# Patient Record
Sex: Female | Born: 1995 | Race: Black or African American | Hispanic: No | Marital: Married | Smoking: Never smoker
Health system: Southern US, Community
[De-identification: ages and names within clinical notes are randomized; demographics above are authoritative.]

## PROBLEM LIST (undated history)

## (undated) DIAGNOSIS — F419 Anxiety disorder, unspecified: Secondary | ICD-10-CM

## (undated) DIAGNOSIS — Z98891 History of uterine scar from previous surgery: Secondary | ICD-10-CM

## (undated) DIAGNOSIS — R519 Headache, unspecified: Secondary | ICD-10-CM

## (undated) DIAGNOSIS — R51 Headache: Secondary | ICD-10-CM

## (undated) DIAGNOSIS — N390 Urinary tract infection, site not specified: Secondary | ICD-10-CM

## (undated) HISTORY — PX: TONSILLECTOMY: SUR1361

---
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
## 2013-03-07 ENCOUNTER — Encounter (HOSPITAL_COMMUNITY): Payer: Self-pay | Admitting: Emergency Medicine

## 2013-03-07 ENCOUNTER — Emergency Department (INDEPENDENT_AMBULATORY_CARE_PROVIDER_SITE_OTHER)
Admission: EM | Admit: 2013-03-07 | Discharge: 2013-03-07 | Disposition: A | Discharge status: Home / Self Care | Payer: No Typology Code available for payment source | Source: Home / Self Care | Attending: Family Medicine | Admitting: Family Medicine

## 2013-03-07 DIAGNOSIS — N390 Urinary tract infection, site not specified: Secondary | ICD-10-CM

## 2013-03-07 LAB — POCT URINALYSIS DIP (DEVICE)
Bilirubin Urine: NEGATIVE
Glucose, UA: NEGATIVE mg/dL
KETONES UR: NEGATIVE mg/dL
Nitrite: NEGATIVE
PH: 5.5 (ref 5.0–8.0)
PROTEIN: 100 mg/dL — AB
SPECIFIC GRAVITY, URINE: 1.025 (ref 1.005–1.030)
UROBILINOGEN UA: 0.2 mg/dL (ref 0.0–1.0)

## 2013-03-07 MED ORDER — PHENAZOPYRIDINE HCL 200 MG PO TABS
200.0000 mg | ORAL_TABLET | Freq: Three times a day (TID) | ORAL | Status: DC
Start: 2013-03-07 — End: 2014-09-29

## 2013-03-07 MED ORDER — CEFUROXIME AXETIL 250 MG PO TABS: 250.0000 mg | ORAL_TABLET | Freq: Two times a day (BID) | ORAL | Status: DC

## 2013-03-07 NOTE — Discharge Instructions (Signed)
Urinary Tract Infection  Urinary tract infections (UTIs) can develop anywhere along your urinary tract. Your urinary tract is your body's drainage system for removing wastes and extra water. Your urinary tract includes two kidneys, two ureters, a bladder, and a urethra. Your kidneys are a pair of bean-shaped organs. Each kidney is about the size of your fist. They are located below your ribs, one on each side of your spine.  CAUSES  Infections are caused by microbes, which are microscopic organisms, including fungi, viruses, and bacteria. These organisms are so small that they can only be seen through a microscope. Bacteria are the microbes that most commonly cause UTIs.  SYMPTOMS   Symptoms of UTIs may vary by age and gender of the patient and by the location of the infection. Symptoms in young women typically include a frequent and intense urge to urinate and a painful, burning feeling in the bladder or urethra during urination. Older women and men are more likely to be tired, shaky, and weak and have muscle aches and abdominal pain. A fever may mean the infection is in your kidneys. Other symptoms of a kidney infection include pain in your back or sides below the ribs, nausea, and vomiting.  DIAGNOSIS  To diagnose a UTI, your caregiver will ask you about your symptoms. Your caregiver also will ask to provide a urine sample. The urine sample will be tested for bacteria and white blood cells. White blood cells are made by your body to help fight infection.  TREATMENT   Typically, UTIs can be treated with medication. Because most UTIs are caused by a bacterial infection, they usually can be treated with the use of antibiotics. The choice of antibiotic and length of treatment depend on your symptoms and the type of bacteria causing your infection.  HOME CARE INSTRUCTIONS   If you were prescribed antibiotics, take them exactly as your caregiver instructs you. Finish the medication even if you feel better after you  have only taken some of the medication.   Drink enough water and fluids to keep your urine clear or pale yellow.   Avoid caffeine, tea, and carbonated beverages. They tend to irritate your bladder.   Empty your bladder often. Avoid holding urine for long periods of time.   Empty your bladder before and after sexual intercourse.   After a bowel movement, women should cleanse from front to back. Use each tissue only once.  SEEK MEDICAL CARE IF:    You have back pain.   You develop a fever.   Your symptoms do not begin to resolve within 3 days.  SEEK IMMEDIATE MEDICAL CARE IF:    You have severe back pain or lower abdominal pain.   You develop chills.   You have nausea or vomiting.   You have continued burning or discomfort with urination.  MAKE SURE YOU:    Understand these instructions.   Will watch your condition.   Will get help right away if you are not doing well or get worse.  Document Released: 10/12/2004 Document Revised: 07/04/2011 Document Reviewed: 02/10/2011  Exit[employer] Patient Information 2014 [employer].

## 2013-03-07 NOTE — ED Provider Notes (Signed)
CSN: [id number]     Arrival date & time 03/07/13  1111 History   First MD Initiated Contact with Patient 03/07/13 1137     Chief Complaint  Patient presents with  . Urinary Tract Infection     (Consider location/radiation/quality/duration/timing/severity/associated sxs/prior Treatment) HPI Comments: 18 year old female presents complaining of possible urinary tract infection. Starting yesterday, she has had hematuria, dysuria, and lower abdominal pressure. She's had multiple urinary tract infections in the past with very similar symptoms. She denies flank pain, fever, NVD. Denies risk factors for STDs.  Patient is a 18 y.o. female presenting with urinary tract infection.  Urinary Tract Infection Associated symptoms include abdominal pain. Pertinent negatives include no chest pain and no shortness of breath.    History reviewed. No pertinent past medical history. Past Surgical History  Procedure Laterality Date  . Tonsillectomy     History reviewed. No pertinent family history. History  Substance Use Topics  . Smoking status: Never Smoker   . Smokeless tobacco: Not on file  . Alcohol Use: No   OB History   Grav Para Term Preterm Abortions TAB SAB Ect Mult Living                 Review of Systems  Constitutional: Negative for fever and chills.  Eyes: Negative for visual disturbance.  Respiratory: Negative for cough and shortness of breath.   Cardiovascular: Negative for chest pain, palpitations and leg swelling.  Gastrointestinal: Positive for abdominal pain. Negative for nausea and vomiting.  Endocrine: Negative for polydipsia and polyuria.  Genitourinary: Positive for dysuria and hematuria. Negative for urgency, frequency, flank pain, decreased urine volume and vaginal discharge.  Musculoskeletal: Negative for arthralgias and myalgias.  Skin: Negative for rash.  Neurological: Negative for dizziness, weakness and light-headedness.      Allergies  Review of patient's  allergies indicates no known allergies.  Home Medications   Current Outpatient Rx  Name  Route  Sig  Dispense  Refill  . cefUROXime (CEFTIN) 250 MG tablet   Oral   Take 1 tablet (250 mg total) by mouth 2 (two) times daily with a meal.   10 tablet   0   . cephALEXin (KEFLEX) 500 MG capsule   Oral   Take 1 capsule (500 mg total) by mouth 4 (four) times daily.   28 capsule   0   . phenazopyridine (PYRIDIUM) 200 MG tablet   Oral   Take 1 tablet (200 mg total) by mouth 3 (three) times daily.   6 tablet   0    BP 93/79  Pulse 76  Temp(Src) 98 F (36.7 C) (Oral)  Resp 16  SpO2 100%  LMP 02/20/2013 Physical Exam  Nursing note and vitals reviewed. Constitutional: She is oriented to person, place, and time. Vital signs are normal. She appears well-developed and well-nourished. No distress.  HENT:  Head: Normocephalic and atraumatic.  Pulmonary/Chest: Effort normal. No respiratory distress.  Abdominal: Soft. She exhibits no mass. There is tenderness (minimal) in the suprapubic area. There is no rebound, no guarding and no CVA tenderness.  Neurological: She is alert and oriented to person, place, and time. She has normal strength. Coordination normal.  Skin: Skin is warm and dry. No rash noted. She is not diaphoretic.  Psychiatric: She has a normal mood and affect. Judgment normal.    ED Course  Procedures (including critical care time) Labs Review Labs Reviewed  POCT URINALYSIS DIP (DEVICE) - Abnormal; Notable for the following:    Hgb  urine dipstick LARGE (*)    Protein, ur 100 (*)    Leukocytes, UA MODERATE (*)    All other components within normal limits  URINE CULTURE   Imaging Review No results found.    MDM   Final diagnoses:  UTI (lower urinary tract infection)    Treated with Ceftin and Pyridium. Urine culture sent. Followup when necessary   Meds ordered this encounter  Medications  . cefUROXime (CEFTIN) 250 MG tablet    Sig: Take 1 tablet (250 mg  total) by mouth 2 (two) times daily with a meal.    Dispense:  10 tablet    Refill:  0    Order Specific Question:  Supervising Provider    Answer:  Linna HoffKINDL, JAMES D [phone][[id number]]  . phenazopyridine (PYRIDIUM) 200 MG tablet    Sig: Take 1 tablet (200 mg total) by mouth 3 (three) times daily.    Dispense:  6 tablet    Refill:  0    Order Specific Question:  Supervising Provider    Answer:  Bradd CanaryKINDL, JAMES D [[id number]]       Kristen GoodZachary H Kasim Mccorkle, PA-C 03/07/13 [id number]

## 2013-03-07 NOTE — ED Provider Notes (Signed)
Medical screening examination/treatment/procedure(s) were performed by resident physician or non-physician practitioner and as supervising physician I was immediately available for consultation/collaboration.   Jenavive Lamboy DOUGLAS MD.   Lewellyn Fultz D Kiran Lapine, MD 03/07/13 1432 

## 2013-03-07 NOTE — ED Notes (Signed)
C/o dysuria. Hematuria.  Since yesterday. Denies fever and any other symptoms.  No relief with AZO or cranberry.

## 2013-03-09 LAB — URINE CULTURE: Colony Count: 40000

## 2013-03-10 NOTE — ED Notes (Signed)
Urine culture: 40,000 colonies E. Coli.  Pt. adequately treated with Ceftin per Dr. Lorenz CoasterKeller.  Vassie MoselleYork, Jada Kuhnert M 03/10/2013

## 2013-11-17 ENCOUNTER — Emergency Department (HOSPITAL_COMMUNITY): Payer: No Typology Code available for payment source

## 2013-11-17 ENCOUNTER — Encounter (HOSPITAL_COMMUNITY): Payer: Self-pay | Admitting: Emergency Medicine

## 2013-11-17 ENCOUNTER — Emergency Department (HOSPITAL_COMMUNITY): Payer: Self-pay

## 2013-11-17 ENCOUNTER — Emergency Department (HOSPITAL_COMMUNITY)
Admission: EM | Admit: 2013-11-17 | Discharge: 2013-11-17 | Disposition: A | Discharge status: Home / Self Care | Payer: Self-pay | Attending: Emergency Medicine | Admitting: Emergency Medicine

## 2013-11-17 DIAGNOSIS — Z792 Long term (current) use of antibiotics: Secondary | ICD-10-CM | POA: Insufficient documentation

## 2013-11-17 DIAGNOSIS — Z3202 Encounter for pregnancy test, result negative: Secondary | ICD-10-CM | POA: Insufficient documentation

## 2013-11-17 DIAGNOSIS — R05 Cough: Secondary | ICD-10-CM | POA: Insufficient documentation

## 2013-11-17 DIAGNOSIS — R079 Chest pain, unspecified: Secondary | ICD-10-CM | POA: Insufficient documentation

## 2013-11-17 DIAGNOSIS — Z79899 Other long term (current) drug therapy: Secondary | ICD-10-CM | POA: Insufficient documentation

## 2013-11-17 LAB — POC URINE PREG, ED: Preg Test, Ur: NEGATIVE

## 2013-11-17 MED ORDER — KETOROLAC TROMETHAMINE 60 MG/2ML IM SOLN
30.0000 mg | Freq: Once | INTRAMUSCULAR | Status: AC
  Administered 2013-11-17: 30 mg via INTRAMUSCULAR
  Filled 2013-11-17: qty 2

## 2013-11-17 NOTE — ED Notes (Signed)
Sudden onset on chest pain started approx 1am last night, while watching tv. Has had a  Cold for 2 days, with coughing.

## 2013-11-17 NOTE — ED Notes (Signed)
Pt c/o mid sternal CP starting last night; pt sts URI sx recently

## 2013-11-17 NOTE — ED Notes (Signed)
Returned from xray

## 2013-11-17 NOTE — ED Provider Notes (Signed)
Patient seen/examined in the Emergency Department in conjunction with [facility] Patient reports recent cough and now has right sided CP Exam : awake/alert, watching TV, point tenderness to right chest Plan: imaging pending.  If negative will d/c home    Joya Gaskinsonald W Darly Massi, MD 11/17/13 1147

## 2013-11-17 NOTE — Discharge Instructions (Signed)
Chest Pain (Nonspecific) °It is often hard to give a diagnosis for the cause of chest pain. There is always a chance that your pain could be related to something serious, such as a heart attack or a blood clot in the lungs. You need to follow up with your doctor. °HOME CARE °· If antibiotic medicine was given, take it as directed by your doctor. Finish the medicine even if you start to feel better. °· For the next few days, avoid activities that bring on chest pain. Continue physical activities as told by your doctor. °· Do not use any tobacco products. This includes cigarettes, chewing tobacco, and e-cigarettes. °· Avoid drinking alcohol. °· Only take medicine as told by your doctor. °· Follow your doctor's suggestions for more testing if your chest pain does not go away. °· Keep all doctor visits you made. °GET HELP IF: °· Your chest pain does not go away, even after treatment. °· You have a rash with blisters on your chest. °· You have a fever. °GET HELP RIGHT AWAY IF:  °· You have more pain or pain that spreads to your arm, neck, jaw, back, or belly (abdomen). °· You have shortness of breath. °· You cough more than usual or cough up blood. °· You have very bad back or belly pain. °· You feel sick to your stomach (nauseous) or throw up (vomit). °· You have very bad weakness. °· You pass out (faint). °· You have chills. °This is an emergency. Do not wait to see if the problems will go away. Call your local emergency services (911 in [place] Do not drive yourself to the hospital. °MAKE SURE YOU:  °· Understand these instructions. °· Will watch your condition. °· Will get help right away if you are not doing well or get worse. °Document Released: 06/21/2007 Document Revised: 01/07/2013 Document Reviewed: 06/21/2007 °ExitCare® Patient Information ©2015 [employer]. This information is not intended to replace advice given to you by your health care provider. Make sure you discuss any questions you have with your  health care provider. ° ° °Emergency Department Resource Guide °1) Find a Doctor and Pay Out of Pocket °Although you won't have to find out who is covered by your insurance plan, it is a good idea to ask around and get recommendations. You will then need to call the office and see if the doctor you have chosen will accept you as a new patient and what types of options they offer for patients who are self-pay. Some doctors offer discounts or will set up payment plans for their patients who do not have insurance, but you will need to ask so you aren't surprised when you get to your appointment. ° °2) Contact Your Local Health Department °Not all health departments have doctors that can see patients for sick visits, but many do, so it is worth a call to see if yours does. If you don't know where your local health department is, you can check in your phone book. The CDC also has a tool to help you locate your state's health department, and many state websites also have listings of all of their local health departments. ° °3) Find a Walk-in Clinic °If your illness is not likely to be very severe or complicated, you may want to try a walk in clinic. These are popping up all over the country in pharmacies, drugstores, and shopping centers. They're usually staffed by nurse practitioners or physician assistants that have been trained to treat common   illnesses and complaints. They're usually fairly quick and inexpensive. However, if you have serious medical issues or chronic medical problems, these are probably not your best option. ° °No Primary Care Doctor: °- Call [facility] at  832-8000 - they can help you locate a primary care doctor that  accepts your insurance, provides certain services, etc. °- Physician Referral Service- 1-800-533-3463 ° °Chronic Pain Problems: °Organization         Address  Phone   Notes  ° [facility]  (336) 297-2271 Patients need to be referred by their primary care doctor.   ° °Medication Assistance: °Organization         Address  Phone   Notes  °[place] [facility] [address] °[place], [place] [address] (336) 641-8030 --Must be a resident of [place] °-- Must have NO insurance coverage whatsoever (no Medicaid/ Medicare, etc.) °-- The pt. MUST have a primary care doctor that directs their care regularly and follows them in the community °  °[facility]  (866) 331-1348   °[employer]  (888) 892-1162   ° °Agencies that provide inexpensive medical care: °Organization         Address  Phone   Notes  °[facility]  (336) 832-8035   °[facility]    (336) 832-7272   °[facility] [address] °[place], [place] [address] (336) 832-4777   °[facility] [address], °[place] (336) 271-4999   °[facility]    (336) 373-0678   °[facility]    (336) 272-1050   °[facility] ° [address], [place] Phone:  (336) 832-4444, Fax:  (336) 832-4440 Hours of Operation:  9 am - 6 pm, M-F.  Also accepts Medicaid/Medicare and self-pay.  °[facility] ° [address], [place] Phone: (336) 832-3150, Fax: (336) 832-3151. Hours of Operation:  8:30 am - 5:30 pm, M-F.  Also accepts Medicaid and self-pay.  °[facility] [place] [address], [place] Phone: (336) 878-6027   °[facility] [address], [place], [place] (336)723-1848, Ext. 123 Mondays & Thursdays: 7-9 AM.  First 15 patients are seen on a first come, first serve basis. °  ° °Medicaid-accepting [place] Providers: ° °Organization         Address  Phone   Notes  °[facility] [address], [place] (336) 641-2100 Also accepts self-pay patients.  °[facility] [address], [place] ° (336) 856-9996   °[facility] [address], [place] (336) 288-8857   °[facility] [address] [place] [address], [place] (336) 299-7000   °Veita Bland [address], [place]  ° (336) 373-1557 Only accepts [place] Access Medicaid patients after they have their name applied to their card.  ° °Self-Pay (no insurance) in [place]: ° °Organization         Address  Phone   Notes  °Sickle Cell Patients, [facility] [address], [place] (336) 832-1970   °[facility] [address], [place] (336) 832-4400   °[facility] ° [address], [place] (336) 992-4800   °[facility]/Dr. Osei-Bonsu ° [address] [place] [address],  or [address],   [place] (336) 841-8500 Phone number for both [place] and [place] locations is the same.  °[facility] [address], [place] (336) 299-0000   °[facility] [address] [place] [address], [place] or [address] (336) 852-7530 °(336) 878-2260   °[facility] [address], [place] (336) 350-1642, phone; (336) 294-5005, fax Sees patients 1st and 3rd Saturday of every month.  Must not qualify for public or private insurance (i.e. Medicaid, Medicare, [place] Health Choice, Veterans' Benefits) • Household income should be no more than 200% of the poverty level •The clinic cannot treat you if you are pregnant or think you are pregnant • Sexually transmitted diseases are not treated at the clinic.  ° ° °Dental Care: °Organization         Address  Phone  Notes  °[facility] [address], [place] (336) 641-6152 Accepts children up to age 21 who are enrolled in Medicaid or [place] Health Choice; pregnant women with a Medicaid card; and children who have applied for Medicaid or [place] Health Choice, but were declined, whose parents can pay a reduced fee at time of service.    °[facility] [place]  [address], [place] (336) 641-7733 Accepts children up to age 21 who are enrolled in Medicaid or [place] Health Choice; pregnant women with a Medicaid card; and children who have applied for Medicaid or [place] Health Choice, but were declined, whose parents can pay a reduced fee at time of service.  °[facility] ° [address], [place] (336) 641-4533 Patients are seen by appointment only. Walk-ins are not accepted. [facility] will see patients 18 years of age and older. °Monday - Tuesday (8am-5pm) °Most Wednesdays (8:30-5pm) °$30 per visit, cash only  °[facility] ° [address], [place] (336) 641-4533 Patients are seen by appointment only. Walk-ins are not accepted. [facility] will see patients 18 years of age and older. °One Wednesday Evening (Monthly: Volunteer Based).  $30 per visit, cash only  °[facility]  (919) 537-3737 for adults; Children under age 4, call Graduate Pediatric Dentistry at (919) [phone]. Children aged 4-14, please call (919) [phone] to request a pediatric application. ° Dental services are provided in all areas of dental care including fillings, crowns and bridges, complete and partial dentures, implants, gum treatment, root canals, and extractions. Preventive care is also provided. Treatment is provided to both adults and children. °Patients are selected via a lottery and there is often a waiting list. °  °[facility] [address], °[place] ° (336) 763-8833 [email or web address] °  °[facility] [address], [place], [place] (336)723-1848, Ext. 123 Second and Fourth Thursday of each month, opens at 6:30 AM; Clinic ends at 9 AM.  Patients are seen on a first-come first-served basis, and a limited number are seen during each clinic.  ° °[facility] ° [address], [place], [place] (336)  723-7904   Eligibility Requirements °You must have lived in [place], [place], or [place] counties for at least the last three months. °  You cannot be eligible for state or federal sponsored healthcare insurance, including Veterans Administration, Medicaid, or Medicare. °  You generally cannot be eligible for healthcare insurance through your employer.  °  How to apply: °Eligibility screenings are held every Tuesday   and Wednesday afternoon from 1:00 pm until 4:00 pm. You do not need an appointment for the interview!  °[facility] [address], [place], [place] 336-631-2330   °[facility]  336-342-8273   °[facility]  336-703-3100   °[facility]  336-570-6415   ° °Behavioral Health Resources in the Community: °Intensive Outpatient Programs °Organization         Address  Phone  Notes  °[facility] [address], [place], [place] 336-878-6098   °[facility] [address], [place], [place] 336-832-9800   °[facility] [address], [place], [place] ° 336-882-2125   °[facility] [address],  °[place], [place] 1-800-853-5163 or 336-641-4981   °Substance Abuse Resources °Organization         Address  Phone  Notes  °[facility]  336-882-2125   °[facility]  336-784-9470   °[facility]  336-285-9073   °[facility]  336-845-3988   °Residential & Outpatient Substance Abuse Program  1-800-659-3381   °Psychological Services °Organization         Address  Phone  Notes  °[facility]  336- 832-9600   °[facility]  336- 378-7881   °[facility] [address], [place] 1-800-853-5163 or 336-641-4981   ° °Mobile Crisis Teams °Organization         Address  Phone  Notes  °[facility], Mobile Crisis Care Unit  1-877-626-1772   °Assertive °[facility] ° [address] [place], [place] 336-834-9664   °Sharon DeEsch [address] °[place] 336-554-5454   ° °Self-Help/Support Groups °Organization         Address  Phone             Notes  °[facility] - variety of support groups  336- 373-1402 Call for more information  °Narcotics Anonymous (NA), [facility] [address], °[place]  2 meetings at this location  ° °Residential Treatment Programs °Organization         Address  Phone  Notes  °[facility] [address],    °[place]  1-866-801-8205   °[facility] ° [address], [place], [place] 704-293-8524   °[facility] [address], [place] 336-845-3988 Admissions: 8am-3pm M-F  °[facility] [address]    °[place], [place] 336-841-1104   °[facility] [address], [place], [place] 336-379-7146   °[facility] [address]  °[place], [place] 336-285-9073   °[facility] - Intensive Outpatient [address], [place], [place] 336-852-3033   °[facility] [address]  °[place], [place] 1-877-615-2722 or 336-784-9470   °[facility] [address] [place], [place] 336-227-7417 Accepts Medicaid  °[facility] [address]  °[place] 1-800-659-3381 Substance Abuse/Addiction Treatment  ° °[place] Behavioral Health Resources °Organization         Address  Phone  Notes  °[facility]  (888) 581-9988   °Julie Brannon, PhD [address] [place], [place]   (336) 349-5553 or (336) 951-0000   °[facility]   [address] °[place], [place] (336) 349-4454   °[facility] [address], [place],  (336) 342-8316 Insurance/Medicaid/sponsorship through [facility]  °[facility] [address]                                    [place], [place] (336) 342-8316  Therapy/tele-psych/case  °[facility] [address]  ° [place], [place] (336) 349-2233    °Dr. Arfeen  (336) 349-4544   °[facility] [place]  [employer] [place] [facility] 1) [address], [place] °2) [address], [place] °3)  [address], [place] (336) 349-3220 °(336) 342-7768 ° °(336) 342-8140   °[place] [facility] (336) 342-1394 or (336) 342-3537 (After Hours)    ° ° ° °

## 2013-11-17 NOTE — ED Provider Notes (Signed)
CSN: [id number]     Arrival date & time 11/17/13  1004 History   First MD Initiated Contact with Patient 11/17/13 1015     Chief Complaint  Patient presents with  . Chest Pain  . URI    Patient is a 18 y.o. female presenting with chest pain. The history is provided by the patient.  Chest Pain Chest pain location: right costosternal margin. Pain quality: aching   Pain radiates to:  Does not radiate Pain radiates to the back: no   Pain severity:  Moderate Onset quality:  Sudden Duration:  10 hours Timing:  Constant Progression:  Unchanged Chronicity:  New Context: at rest   Context: not breathing   Relieved by:  None tried Worsened by:  Nothing tried Ineffective treatments:  None tried Associated symptoms: cough   Associated symptoms: no abdominal pain, no altered mental status, no back pain, no diaphoresis, no fever, no headache, no lower extremity edema, no nausea, no shortness of breath and not vomiting   Cough:    Cough characteristics:  Non-productive   Severity:  Moderate   Onset quality:  Gradual   Duration:  2 days   Timing:  Intermittent   Progression:  Improving   Chronicity:  New Risk factors: no birth control, no immobilization, not obese, no prior DVT/PE and no smoking     History reviewed. No pertinent past medical history. Past Surgical History  Procedure Laterality Date  . Tonsillectomy     History reviewed. No pertinent family history. History  Substance Use Topics  . Smoking status: Never Smoker   . Smokeless tobacco: Not on file  . Alcohol Use: No   OB History    No data available     Review of Systems  Constitutional: Negative for fever and diaphoresis.  Respiratory: Positive for cough. Negative for shortness of breath.   Cardiovascular: Positive for chest pain.  Gastrointestinal: Negative for nausea, vomiting and abdominal pain.  Musculoskeletal: Negative for back pain.  Skin: Negative for rash.  Neurological: Negative for headaches.   All other systems reviewed and are negative.   Allergies  Review of patient's allergies indicates no known allergies.  Home Medications   Prior to Admission medications   Medication Sig Start Date End Date Taking? Authorizing Provider  cefUROXime (CEFTIN) 250 MG tablet Take 1 tablet (250 mg total) by mouth 2 (two) times daily with a meal. 03/07/13   Graylon GoodZachary H Baker, PA-C  cephALEXin (KEFLEX) 500 MG capsule Take 1 capsule (500 mg total) by mouth 4 (four) times daily. 03/14/12   Hayden Rasmussenavid Mabe, NP  phenazopyridine (PYRIDIUM) 200 MG tablet Take 1 tablet (200 mg total) by mouth 3 (three) times daily. 03/07/13   Adrian BlackwaterZachary H Baker, PA-C   BP 127/81 mmHg  Pulse 88  Temp(Src) 97.6 F (36.4 C) (Oral)  Resp 14  Ht 4\' 11"  (1.499 m)  Wt 110 lb (49.896 kg)  BMI 22.21 kg/m2  SpO2 99%   Physical Exam  Constitutional: She is oriented to person, place, and time. She appears well-developed and well-nourished. She is cooperative. No distress.  HENT:  Head: Normocephalic and atraumatic.  Right Ear: External ear normal.  Left Ear: External ear normal.  Neck: Normal range of motion and phonation normal.  Cardiovascular: Normal rate and regular rhythm.   Pulses:      Radial pulses are 2+ on the right side, and 2+ on the left side.       Dorsalis pedis pulses are 2+ on the right  side, and 2+ on the left side.  No peripheral edema  Pulmonary/Chest: Effort normal and breath sounds normal. No respiratory distress. She has no wheezes. She has no rales. She exhibits tenderness.  Point tender at the right costosternal margin  Abdominal: Soft. She exhibits no distension. There is no tenderness. There is no rebound and no guarding.  Neurological: She is alert and oriented to person, place, and time. GCS eye subscore is 4. GCS verbal subscore is 5. GCS motor subscore is 6.  Skin: Skin is warm and dry. No rash noted. She is not diaphoretic.    ED Course  Procedures  Labs Review Results for orders placed or  performed during the hospital encounter of 11/17/13  POC urine preg, ED ([facility])  Result Value Ref Range   Preg Test, Ur NEGATIVE NEGATIVE   Imaging Review Dg Chest 2 View  11/17/2013   CLINICAL DATA:  Sudden onset chest pain.  Cough  EXAM: CHEST  2 VIEW  COMPARISON:  None.  FINDINGS: Lungs are clear. Heart size and pulmonary vascularity are normal. No adenopathy. No pneumothorax. No bone lesions.  IMPRESSION: No abnormality noted.   Electronically Signed   By: Bretta BangWilliam  Woodruff M.D.   On: 11/17/2013 11:52     EKG Interpretation   Date/Time:  Monday November 17 2013 10:10:17 EST Ventricular Rate:  112 PR Interval:  136 QRS Duration: 84 QT Interval:  350 QTC Calculation: 477 R Axis:   66 Text Interpretation:  Sinus tachycardia Nonspecific T wave abnormality  Abnormal ECG No previous ECGs available Confirmed by Bebe ShaggyWICKLINE  MD, DONALD  [phone]([id number]) on 11/17/2013 10:18:28 AM      MDM   Final diagnoses:  Chest pain   18 y.o. otherwise healthy female presenting with aching right sided chest pain. Worse with palpation. Very atypical. Not on any birth control, no unilateral leg swelling, no hx of DVT/PE, no recent surgeries. Her pain is reproducible, point tender over her right costosternal margin - I have no concern for PE, ACS. CXR with no pneumothorax, pneumonia, no wide mediastinum.   Will give her a dose of IM toradol and reassess.   On re-evaluation the patient is pain free after a dose of IM toradol. I discussed very strict return precautions with the patient and her mother -- these were given in writing as well. She is to follow up with a PCP (resource guide given).   This case managed in conjunction with my attending, Dr. Bebe ShaggyWickline.    Maxine GlennAnn Sherrill Buikema, MD 11/17/13 [phone]

## 2013-12-23 ENCOUNTER — Encounter (HOSPITAL_COMMUNITY): Payer: Self-pay | Admitting: Emergency Medicine

## 2013-12-23 ENCOUNTER — Emergency Department (INDEPENDENT_AMBULATORY_CARE_PROVIDER_SITE_OTHER)
Admission: EM | Admit: 2013-12-23 | Discharge: 2013-12-23 | Disposition: A | Discharge status: Home / Self Care | Payer: Self-pay | Source: Home / Self Care | Attending: Family Medicine | Admitting: Family Medicine

## 2013-12-23 DIAGNOSIS — B349 Viral infection, unspecified: Secondary | ICD-10-CM

## 2013-12-23 DIAGNOSIS — G44209 Tension-type headache, unspecified, not intractable: Secondary | ICD-10-CM

## 2013-12-23 DIAGNOSIS — R1084 Generalized abdominal pain: Secondary | ICD-10-CM

## 2013-12-23 LAB — POCT URINALYSIS DIP (DEVICE)
GLUCOSE, UA: NEGATIVE mg/dL
Ketones, ur: 80 mg/dL — AB
NITRITE: NEGATIVE
PROTEIN: 100 mg/dL — AB
SPECIFIC GRAVITY, URINE: 1.015 (ref 1.005–1.030)
UROBILINOGEN UA: 2 mg/dL — AB (ref 0.0–1.0)
pH: 6 (ref 5.0–8.0)

## 2013-12-23 LAB — POCT PREGNANCY, URINE: PREG TEST UR: NEGATIVE

## 2013-12-23 NOTE — ED Provider Notes (Signed)
CSN: [id number]     Arrival date & time 12/23/13  1015 History   First MD Initiated Contact with Patient 12/23/13 1041     Chief Complaint  Patient presents with  . Abdominal Pain  . Headache   (Consider location/radiation/quality/duration/timing/severity/associated sxs/prior Treatment)  HPI            18 year old female presents complaining of headache and abdominal pain. This initially started on Friday, 3 days ago. She had onset of fever to 102F, headache across her forehead, mild central abdominal pain. Since then her symptoms have gotten better. She has had no more fever any abdominal pain slightly better. She still getting the headache which is consistently responsive to ibuprofen. She denies any nausea, vomiting, diarrhea. Abdominal pain is mild, 3-4/10. No chest pain or shortness of breath. No cough. No recent travel or sick contacts   History reviewed. No pertinent past medical history. Past Surgical History  Procedure Laterality Date  . Tonsillectomy     No family history on file. History  Substance Use Topics  . Smoking status: Never Smoker   . Smokeless tobacco: Not on file  . Alcohol Use: No   OB History    No data available     Review of Systems  Constitutional: Positive for fever, chills and fatigue.  HENT: Positive for congestion and sore throat (mild, mostly resolved).   Respiratory: Negative for cough.   Gastrointestinal: Positive for abdominal pain. Negative for nausea, vomiting and diarrhea.  Neurological: Positive for headaches. Negative for speech difficulty.  All other systems reviewed and are negative.   Allergies  Review of patient's allergies indicates no known allergies.  Home Medications   Prior to Admission medications   Medication Sig Start Date End Date Taking? Authorizing Provider  cefUROXime (CEFTIN) 250 MG tablet Take 1 tablet (250 mg total) by mouth 2 (two) times daily with a meal. 03/07/13   Graylon GoodZachary H Kylie Simmonds, PA-C  cephALEXin (KEFLEX) 500  MG capsule Take 1 capsule (500 mg total) by mouth 4 (four) times daily. 03/14/12   Hayden Rasmussenavid Mabe, NP  phenazopyridine (PYRIDIUM) 200 MG tablet Take 1 tablet (200 mg total) by mouth 3 (three) times daily. 03/07/13   Adrian BlackwaterZachary H Keviana Guida, PA-C   BP 100/62 mmHg  Pulse 106  Temp(Src) 97.8 F (36.6 C) (Oral)  Resp 16  SpO2 100% Physical Exam  Constitutional: She is oriented to person, place, and time. Vital signs are normal. She appears well-developed and well-nourished. No distress.  HENT:  Head: Normocephalic and atraumatic.  Right Ear: External ear normal.  Left Ear: External ear normal.  Nose: Nose normal.  Mouth/Throat: Oropharynx is clear and moist. No oropharyngeal exudate.  Eyes: Conjunctivae are normal. Right eye exhibits no discharge. Left eye exhibits no discharge.  Neck: Normal range of motion. Neck supple.  Cardiovascular: Normal rate, regular rhythm and normal heart sounds.   Heart rate 88 manually  Pulmonary/Chest: Effort normal and breath sounds normal. No respiratory distress.  Abdominal: Soft. Bowel sounds are normal. She exhibits no distension and no mass. There is tenderness (mild tenderness in the epigastrium and periumbilical). There is no rebound, no guarding and no CVA tenderness.  Lymphadenopathy:    She has no cervical adenopathy.  Neurological: She is alert and oriented to person, place, and time. She has normal strength. Coordination normal.  Skin: Skin is warm and dry. No rash noted. She is not diaphoretic.  Psychiatric: She has a normal mood and affect. Judgment normal.  Nursing note and vitals  reviewed.   ED Course  Procedures (including critical care time) Labs Review Labs Reviewed  POCT URINALYSIS DIP (DEVICE) - Abnormal; Notable for the following:    Bilirubin Urine SMALL (*)    Ketones, ur 80 (*)    Hgb urine dipstick TRACE (*)    Protein, ur 100 (*)    Urobilinogen, UA 2.0 (*)    Leukocytes, UA SMALL (*)    All other components within normal limits   URINE CULTURE  POCT PREGNANCY, URINE    Imaging Review No results found.   MDM   1. Tension headache   2. Abdominal pain, generalized   3. Viral illness    Urine culture sent.  Most likely viral illness. Continue to treat symptomatically. Follow-up if worsening.    Graylon GoodZachary H Lamarion Mcevers, PA-C 12/23/13 [phone]

## 2013-12-23 NOTE — Discharge Instructions (Signed)
Abdominal Pain, Women °Abdominal (stomach, pelvic, or belly) pain can be caused by many things. It is important to tell your doctor: °· The location of the pain. °· Does it come and go or is it present all the time? °· Are there things that start the pain (eating certain foods, exercise)? °· Are there other symptoms associated with the pain (fever, nausea, vomiting, diarrhea)? °All of this is helpful to know when trying to find the cause of the pain. °CAUSES  °· Stomach: virus or bacteria infection, or ulcer. °· Intestine: appendicitis (inflamed appendix), regional ileitis (Crohn's disease), ulcerative colitis (inflamed colon), irritable bowel syndrome, diverticulitis (inflamed diverticulum of the colon), or cancer of the stomach or intestine. °· Gallbladder disease or stones in the gallbladder. °· Kidney disease, kidney stones, or infection. °· Pancreas infection or cancer. °· Fibromyalgia (pain disorder). °· Diseases of the female organs: °· Uterus: fibroid (non-cancerous) tumors or infection. °· Fallopian tubes: infection or tubal pregnancy. °· Ovary: cysts or tumors. °· Pelvic adhesions (scar tissue). °· Endometriosis (uterus lining tissue growing in the pelvis and on the pelvic organs). °· Pelvic congestion syndrome (female organs filling up with blood just before the menstrual period). °· Pain with the menstrual period. °· Pain with ovulation (producing an egg). °· Pain with an IUD (intrauterine device, birth control) in the uterus. °· Cancer of the female organs. °· Functional pain (pain not caused by a disease, may improve without treatment). °· Psychological pain. °· Depression. °DIAGNOSIS  °Your doctor will decide the seriousness of your pain by doing an examination. °· Blood tests. °· X-rays. °· Ultrasound. °· CT scan (computed tomography, special type of X-ray). °· MRI (magnetic resonance imaging). °· Cultures, for infection. °· Barium enema (dye inserted in the large intestine, to better view it with  X-rays). °· Colonoscopy (looking in intestine with a lighted tube). °· Laparoscopy (minor surgery, looking in abdomen with a lighted tube). °· Major abdominal exploratory surgery (looking in abdomen with a large incision). °TREATMENT  °The treatment will depend on the cause of the pain.  °· Many cases can be observed and treated at home. °· Over-the-counter medicines recommended by your caregiver. °· Prescription medicine. °· Antibiotics, for infection. °· Birth control pills, for painful periods or for ovulation pain. °· Hormone treatment, for endometriosis. °· Nerve blocking injections. °· Physical therapy. °· Antidepressants. °· Counseling with a psychologist or psychiatrist. °· Minor or major surgery. °HOME CARE INSTRUCTIONS  °· Do not take laxatives, unless directed by your caregiver. °· Take over-the-counter pain medicine only if ordered by your caregiver. Do not take aspirin because it can cause an upset stomach or bleeding. °· Try a clear liquid diet (broth or water) as ordered by your caregiver. Slowly move to a bland diet, as tolerated, if the pain is related to the stomach or intestine. °· Have a thermometer and take your temperature several times a day, and record it. °· Bed rest and sleep, if it helps the pain. °· Avoid sexual intercourse, if it causes pain. °· Avoid stressful situations. °· Keep your follow-up appointments and tests, as your caregiver orders. °· If the pain does not go away with medicine or surgery, you may try: °· Acupuncture. °· Relaxation exercises (yoga, meditation). °· Group therapy. °· Counseling. °SEEK MEDICAL CARE IF:  °· You notice certain foods cause stomach pain. °· Your home care treatment is not helping your pain. °· You need stronger pain medicine. °· You want your IUD removed. °· You feel faint or   lightheaded.  You develop nausea and vomiting.  You develop a rash.  You are having side effects or an allergy to your medicine. SEEK IMMEDIATE MEDICAL CARE IF:   Your  pain does not go away or gets worse.  You have a fever.  Your pain is felt only in portions of the abdomen. The right side could possibly be appendicitis. The left lower portion of the abdomen could be colitis or diverticulitis.  You are passing blood in your stools (bright red or black tarry stools, with or without vomiting).  You have blood in your urine.  You develop chills, with or without a fever.  You pass out. MAKE SURE YOU:   Understand these instructions.  Will watch your condition.  Will get help right away if you are not doing well or get worse. Document Released: 10/30/2006 Document Revised: 05/19/2013 Document Reviewed: 11/19/2008 [facility] Patient Information 2015 [place], [place]. This information is not intended to replace advice given to you by your health care provider. Make sure you discuss any questions you have with your health care provider.  Tension Headache A tension headache is a feeling of pain, pressure, or aching often felt over the front and sides of the head. The pain can be dull or can feel tight (constricting). It is the most common type of headache. Tension headaches are not normally associated with nausea or vomiting and do not get worse with physical activity. Tension headaches can last 30 minutes to several days.  CAUSES  The exact cause is not known, but it may be caused by chemicals and hormones in the brain that lead to pain. Tension headaches often begin after stress, anxiety, or depression. Other triggers may include:  Alcohol.  Caffeine (too much or withdrawal).  Respiratory infections (colds, flu, sinus infections).  Dental problems or teeth clenching.  Fatigue.  Holding your head and neck in one position too long while using a computer. SYMPTOMS   Pressure around the head.   Dull, aching head pain.   Pain felt over the front and sides of the head.   Tenderness in the muscles of the head, neck, and shoulders. DIAGNOSIS  A  tension headache is often diagnosed based on:   Symptoms.   Physical examination.   A CT scan or MRI of your head. These tests may be ordered if symptoms are severe or unusual. TREATMENT  Medicines may be given to help relieve symptoms.  HOME CARE INSTRUCTIONS   Only take over-the-counter or prescription medicines for pain or discomfort as directed by your caregiver.   Lie down in a dark, quiet room when you have a headache.   Keep a journal to find out what may be triggering your headaches. For example, write down:  What you eat and drink.  How much sleep you get.  Any change to your diet or medicines.  Try massage or other relaxation techniques.   Ice packs or heat applied to the head and neck can be used. Use these 3 to 4 times per day for 15 to 20 minutes each time, or as needed.   Limit stress.   Sit up straight, and do not tense your muscles.   Quit smoking if you smoke.  Limit alcohol use.  Decrease the amount of caffeine you drink, or stop drinking caffeine.  Eat and exercise regularly.  Get 7 to 9 hours of sleep, or as recommended by your caregiver.  Avoid excessive use of pain medicine as recurrent headaches can occur.  SEEK MEDICAL CARE IF:   You have problems with the medicines you were prescribed.  Your medicines do not work.  You have a change from the usual headache.  You have nausea or vomiting. SEEK IMMEDIATE MEDICAL CARE IF:   Your headache becomes severe.  You have a fever.  You have a stiff neck.  You have loss of vision.  You have muscular weakness or loss of muscle control.  You lose your balance or have trouble walking.  You feel faint or pass out.  You have severe symptoms that are different from your first symptoms. MAKE SURE YOU:   Understand these instructions.  Will watch your condition.  Will get help right away if you are not doing well or get worse. Document Released: 01/02/2005 Document Revised:  03/27/2011 Document Reviewed: 12/23/2010 [facility]ExitCare Patient Information 2015 [place], [place]LL[place]. This information is not intended to replace advice given to you by your health care provider. Make sure you discuss any questions you have with your health care provider.  Viral Infections A viral infection can be caused by different types of viruses.Most viral infections are not serious and resolve on their own. However, some infections may cause severe symptoms and may lead to further complications. SYMPTOMS Viruses can frequently cause:  Minor sore throat.  Aches and pains.  Headaches.  Runny nose.  Different types of rashes.  Watery eyes.  Tiredness.  Cough.  Loss of appetite.  Gastrointestinal infections, resulting in nausea, vomiting, and diarrhea. These symptoms do not respond to antibiotics because the infection is not caused by bacteria. However, you might catch a bacterial infection following the viral infection. This is sometimes called a "superinfection." Symptoms of such a bacterial infection may include:  Worsening sore throat with pus and difficulty swallowing.  Swollen neck glands.  Chills and a high or persistent fever.  Severe headache.  Tenderness over the sinuses.  Persistent overall ill feeling (malaise), muscle aches, and tiredness (fatigue).  Persistent cough.  Yellow, green, or brown mucus production with coughing. HOME CARE INSTRUCTIONS   Only take over-the-counter or prescription medicines for pain, discomfort, diarrhea, or fever as directed by your caregiver.  Drink enough water and fluids to keep your urine clear or pale yellow. Sports drinks can provide valuable electrolytes, sugars, and hydration.  Get plenty of rest and maintain proper nutrition. Soups and broths with crackers or rice are fine. SEEK IMMEDIATE MEDICAL CARE IF:   You have severe headaches, shortness of breath, chest pain, neck pain, or an unusual rash.  You have uncontrolled  vomiting, diarrhea, or you are unable to keep down fluids.  You or your child has an oral temperature above 102 F (38.9 C), not controlled by medicine.  Your baby is older than 3 months with a rectal temperature of 102 F (38.9 C) or higher.  Your baby is 253 months old or younger with a rectal temperature of 100.4 F (38 C) or higher. MAKE SURE YOU:   Understand these instructions.  Will watch your condition.  Will get help right away if you are not doing well or get worse. Document Released: 10/12/2004 Document Revised: 03/27/2011 Document Reviewed: 05/09/2010 [facility]ExitCare Patient Information 2015 [place] GroveExitCare, [place]LLC. This information is not intended to replace advice given to you by your health care provider. Make sure you discuss any questions you have with your health care provider.

## 2013-12-23 NOTE — ED Notes (Signed)
C/o head pain, stomach soreness, pointing to center of abdomen.  No vomiting, no diarrhea, no nausea.  Denies urinary symptoms.  Denies vaginal discharge, last bm was this am and normal

## 2013-12-24 LAB — URINE CULTURE
Colony Count: NO GROWTH
Culture: NO GROWTH

## 2014-09-29 ENCOUNTER — Inpatient Hospital Stay (HOSPITAL_COMMUNITY)
Admission: AD | Admit: 2014-09-29 | Discharge: 2014-10-03 | DRG: 765 | Disposition: A | Discharge status: Home / Self Care | Payer: Medicaid Other | Source: Ambulatory Visit | Attending: Obstetrics and Gynecology | Admitting: Obstetrics and Gynecology

## 2014-09-29 ENCOUNTER — Encounter (HOSPITAL_COMMUNITY): Payer: Self-pay | Admitting: *Deleted

## 2014-09-29 DIAGNOSIS — Z713 Dietary counseling and surveillance: Secondary | ICD-10-CM | POA: Diagnosis present

## 2014-09-29 DIAGNOSIS — Z6841 Body Mass Index (BMI) 40.0 and over, adult: Secondary | ICD-10-CM

## 2014-09-29 DIAGNOSIS — R51 Headache: Secondary | ICD-10-CM | POA: Diagnosis present

## 2014-09-29 DIAGNOSIS — B9789 Other viral agents as the cause of diseases classified elsewhere: Secondary | ICD-10-CM | POA: Diagnosis present

## 2014-09-29 DIAGNOSIS — O403XX0 Polyhydramnios, third trimester, not applicable or unspecified: Secondary | ICD-10-CM | POA: Diagnosis present

## 2014-09-29 DIAGNOSIS — F419 Anxiety disorder, unspecified: Secondary | ICD-10-CM | POA: Diagnosis present

## 2014-09-29 DIAGNOSIS — N390 Urinary tract infection, site not specified: Secondary | ICD-10-CM | POA: Diagnosis present

## 2014-09-29 DIAGNOSIS — Z8249 Family history of ischemic heart disease and other diseases of the circulatory system: Secondary | ICD-10-CM | POA: Diagnosis not present

## 2014-09-29 DIAGNOSIS — Z3A39 39 weeks gestation of pregnancy: Secondary | ICD-10-CM | POA: Diagnosis present

## 2014-09-29 DIAGNOSIS — O99214 Obesity complicating childbirth: Secondary | ICD-10-CM | POA: Diagnosis present

## 2014-09-29 DIAGNOSIS — Z98891 History of uterine scar from previous surgery: Secondary | ICD-10-CM

## 2014-09-29 HISTORY — DX: Headache, unspecified: R51.9

## 2014-09-29 HISTORY — DX: Anxiety disorder, unspecified: F41.9

## 2014-09-29 HISTORY — DX: Headache: R51

## 2014-09-29 HISTORY — DX: Urinary tract infection, site not specified: N39.0

## 2014-09-29 HISTORY — DX: History of uterine scar from previous surgery: Z98.891

## 2014-09-29 LAB — OB RESULTS CONSOLE ANTIBODY SCREEN: ANTIBODY SCREEN: NEGATIVE

## 2014-09-29 LAB — CBC
HEMATOCRIT: 40.5 % (ref 36.0–46.0)
HEMOGLOBIN: 13.9 g/dL (ref 12.0–15.0)
MCH: 31.2 pg (ref 26.0–34.0)
MCHC: 34.3 g/dL (ref 30.0–36.0)
MCV: 90.8 fL (ref 78.0–100.0)
Platelets: 150 10*3/uL (ref 150–400)
RBC: 4.46 MIL/uL (ref 3.87–5.11)
RDW: 14.3 % (ref 11.5–15.5)
WBC: 15.6 10*3/uL — ABNORMAL HIGH (ref 4.0–10.5)

## 2014-09-29 LAB — OB RESULTS CONSOLE ABO/RH: RH Type: POSITIVE

## 2014-09-29 LAB — OB RESULTS CONSOLE GC/CHLAMYDIA
CHLAMYDIA, DNA PROBE: NEGATIVE
Gonorrhea: NEGATIVE

## 2014-09-29 LAB — OB RESULTS CONSOLE HEPATITIS B SURFACE ANTIGEN: HEP B S AG: NEGATIVE

## 2014-09-29 LAB — OB RESULTS CONSOLE RPR: RPR: NONREACTIVE

## 2014-09-29 LAB — OB RESULTS CONSOLE HIV ANTIBODY (ROUTINE TESTING): HIV: NONREACTIVE

## 2014-09-29 LAB — ABO/RH: ABO/RH(D): O POS

## 2014-09-29 LAB — TYPE AND SCREEN
ABO/RH(D): O POS
Antibody Screen: NEGATIVE

## 2014-09-29 LAB — OB RESULTS CONSOLE RUBELLA ANTIBODY, IGM: Rubella: IMMUNE

## 2014-09-29 LAB — OB RESULTS CONSOLE GBS: STREP GROUP B AG: NEGATIVE

## 2014-09-29 MED ORDER — OXYTOCIN 40 UNITS IN LACTATED RINGERS INFUSION - SIMPLE MED
1.0000 m[IU]/min | INTRAVENOUS | Status: DC
  Administered 2014-09-29: 2 m[IU]/min via INTRAVENOUS
  Administered 2014-09-29: 4 m[IU]/min via INTRAVENOUS
  Filled 2014-09-29: qty 1000

## 2014-09-29 MED ORDER — LIDOCAINE HCL (PF) 1 % IJ SOLN
30.0000 mL | INTRAMUSCULAR | Status: DC | PRN
  Filled 2014-09-29: qty 30

## 2014-09-29 MED ORDER — OXYCODONE-ACETAMINOPHEN 5-325 MG PO TABS: 1.0000 | ORAL_TABLET | ORAL | Status: DC | PRN

## 2014-09-29 MED ORDER — EPHEDRINE 5 MG/ML INJ: 10.0000 mg | INTRAVENOUS | Status: DC | PRN

## 2014-09-29 MED ORDER — LACTATED RINGERS IV SOLN
500.0000 mL | INTRAVENOUS | Status: DC | PRN
  Administered 2014-09-29: 500 mL via INTRAVENOUS
  Administered 2014-09-29: 1000 mL via INTRAVENOUS
  Administered 2014-09-30: 300 mL via INTRAVENOUS
  Administered 2014-09-30: 200 mL via INTRAVENOUS

## 2014-09-29 MED ORDER — PHENYLEPHRINE 40 MCG/ML (10ML) SYRINGE FOR IV PUSH (FOR BLOOD PRESSURE SUPPORT): 80.0000 ug | PREFILLED_SYRINGE | INTRAVENOUS | Status: DC | PRN

## 2014-09-29 MED ORDER — DIPHENHYDRAMINE HCL 50 MG/ML IJ SOLN: 12.5000 mg | INTRAMUSCULAR | Status: DC | PRN

## 2014-09-29 MED ORDER — LACTATED RINGERS IV SOLN
INTRAVENOUS | Status: DC
Start: 2014-09-29 — End: 2014-09-30
  Administered 2014-09-29: 125 mL/h via INTRAVENOUS
  Administered 2014-09-29 – 2014-09-30 (×3): via INTRAVENOUS

## 2014-09-29 MED ORDER — LACTATED RINGERS IV SOLN
INTRAVENOUS | Status: DC
  Administered 2014-09-29: 23:00:00 via INTRAUTERINE

## 2014-09-29 MED ORDER — OXYTOCIN 40 UNITS IN LACTATED RINGERS INFUSION - SIMPLE MED: 62.5000 mL/h | INTRAVENOUS | Status: DC

## 2014-09-29 MED ORDER — ONDANSETRON HCL 4 MG/2ML IJ SOLN: 4.0000 mg | Freq: Four times a day (QID) | INTRAMUSCULAR | Status: DC | PRN

## 2014-09-29 MED ORDER — FENTANYL 2.5 MCG/ML BUPIVACAINE 1/10 % EPIDURAL INFUSION (WH - ANES)
14.0000 mL/h | INTRAMUSCULAR | Status: DC | PRN
Start: 2014-09-29 — End: 2014-09-30

## 2014-09-29 MED ORDER — PROMETHAZINE HCL 25 MG/ML IJ SOLN
12.5000 mg | Freq: Four times a day (QID) | INTRAMUSCULAR | Status: DC | PRN
  Administered 2014-09-29: 12.5 mg via INTRAVENOUS
  Filled 2014-09-29: qty 1

## 2014-09-29 MED ORDER — BUTORPHANOL TARTRATE 1 MG/ML IJ SOLN
1.0000 mg | INTRAMUSCULAR | Status: DC | PRN
  Administered 2014-09-29 (×2): 1 mg via INTRAVENOUS
  Filled 2014-09-29 (×2): qty 1

## 2014-09-29 MED ORDER — CITRIC ACID-SODIUM CITRATE 334-500 MG/5ML PO SOLN
30.0000 mL | ORAL | Status: DC | PRN
  Administered 2014-09-30: 30 mL via ORAL
  Filled 2014-09-29: qty 15

## 2014-09-29 MED ORDER — TERBUTALINE SULFATE 1 MG/ML IJ SOLN: 0.2500 mg | Freq: Once | INTRAMUSCULAR | Status: DC | PRN

## 2014-09-29 MED ORDER — OXYCODONE-ACETAMINOPHEN 5-325 MG PO TABS: 2.0000 | ORAL_TABLET | ORAL | Status: DC | PRN

## 2014-09-29 MED ORDER — OXYTOCIN BOLUS FROM INFUSION: 500.0000 mL | INTRAVENOUS | Status: DC

## 2014-09-29 MED ORDER — ACETAMINOPHEN 325 MG PO TABS
650.0000 mg | ORAL_TABLET | ORAL | Status: DC | PRN
  Administered 2014-09-29: 650 mg via ORAL
  Filled 2014-09-29: qty 2

## 2014-09-29 NOTE — Progress Notes (Signed)
Patient ID: Kristen Gomez, female   DOB: [birth year], 19 y.o.   MRN: [id number]  Admitted from MAU - non-reassuring FHT  AFVSS gen NAD FHTs 160's, mod var, variable decels, category 2 toco Q 2-51min  SVE 2/70/-1  AROM for clear fluid, w/o diff/comp  Continue IOL Close monitoring

## 2014-09-29 NOTE — H&P (Signed)
[name] is a 19 y.o. female G1P0 at 39+5 for NST in office with 6 min decel, then nonreactive strip.  6/8 BPP.  For IOL, d/w pt long process and r/b/a.   Relatively uncomplicated PNC.   Pt declined Tdap.    Maternal Medical History:  Contractions: Frequency: irregular.    Fetal activity: Perceived fetal activity is normal.    Prenatal Complications - Diabetes: none.    OB History    Gravida Para Term Preterm AB TAB SAB Ectopic Multiple Living   1         0    G1 present No pap No STDs  Past Medical History  Diagnosis Date  . Anxiety   . Headache   . UTI (urinary tract infection)    Past Surgical History  Procedure Laterality Date  . Tonsillectomy     Family History: mo HTN Social History:  reports that she has never smoked. She does not have any smokeless tobacco history on file. She reports that she does not drink alcohol or use illicit drugs. Cashier, single Meds PNV All NKDA   Prenatal Transfer Tool  Maternal Diabetes: No Genetic Screening: Normal Maternal Ultrasounds/Referrals: Abnormal:  Findings:   Isolated EIF (echogenic intracardiac focus) Fetal Ultrasounds or other Referrals:  None Maternal Substance Abuse:  No Significant Maternal Medications:  None Significant Maternal Lab Results:  Lab values include: Group B Strep negative Other Comments:  declined Tdap  Review of Systems  Constitutional: Negative.   HENT: Negative.   Eyes: Negative.   Respiratory: Negative.   Cardiovascular: Negative.   Gastrointestinal: Negative.   Genitourinary: Negative.   Musculoskeletal: Positive for back pain.  Skin: Negative.   Neurological: Negative.   Psychiatric/Behavioral: Negative.       Blood pressure 118/73, pulse 118, temperature 99 F (37.2 C), temperature source Oral, resp. rate 18, height  (1.422 m), weight 84.369 kg (186 lb), last menstrual period 11/14/2013. Maternal Exam:  Uterine Assessment: Contraction frequency is irregular.    Abdomen: Patient reports no abdominal tenderness. Fundal height is appropriate for gestation.   Estimated fetal weight is 7#.   Fetal presentation: vertex  Introitus: Normal vagina.  Pelvis: adequate for delivery.   Cervix: Cervix evaluated by digital exam.     Physical Exam  Constitutional: She is oriented to person, place, and time. She appears well-developed and well-nourished.  HENT:  Head: Normocephalic and atraumatic.  Cardiovascular: Normal rate and regular rhythm.   Respiratory: Effort normal and breath sounds normal. No respiratory distress. She has no wheezes.  GI: Soft. Bowel sounds are normal. She exhibits no distension. There is no tenderness.  Musculoskeletal: Normal range of motion.  Neurological: She is alert and oriented to person, place, and time.  Skin: Skin is warm and dry.  Psychiatric: She has a normal mood and affect. Her behavior is normal.    Prenatal labs: ABO, Rh: O/Positive/-- (09/13 1627) Antibody: Negative (09/13 1627) Rubella: Immune (09/13 1627) RPR: Nonreactive (09/13 1627)  HBsAg: Negative (09/13 1627)  HIV: Non-reactive (09/13 1627)  GBS: Negative (09/13 1627)    Hgb 12.4/Plt 256/Ur Cx neg/GC neg/Chl neg/Hgb electro WNL/ First Tri and AFP WNL/glucola 129/CF neg  [place] nl anat, EIF, female  Declined Tdap [place] growth WNL - polyhydramnios = 30 at last check Assessment/Plan: 18yo G1P0 at 39+ for IOL AROM and pitocin for IOL Epidural PRN   Gomez, Kristen Gomez 09/29/2014, 4:58 PM

## 2014-09-29 NOTE — MAU Note (Signed)
Sent from office for direct admit due to decel and BPP 6/8

## 2014-09-29 NOTE — Progress Notes (Signed)
Patient ID: Kristen Gomez, female   DOB: [birth year], 19 y.o.   MRN: [id number]  Uncomfortable with ctx  AFVSS gen NAD FHTs 160-170, mod var, some variables, category 2 toco Q 2-86min  IUPC placed/FSE placed, w/o diff/comp SVE 2/70/-2  Continue current mgmt

## 2014-09-30 ENCOUNTER — Encounter (HOSPITAL_COMMUNITY)
Admission: AD | Disposition: A | Discharge status: Home / Self Care | Payer: Self-pay | Source: Ambulatory Visit | Attending: Obstetrics and Gynecology

## 2014-09-30 ENCOUNTER — Inpatient Hospital Stay (HOSPITAL_COMMUNITY): Payer: Medicaid Other | Admitting: Anesthesiology

## 2014-09-30 ENCOUNTER — Encounter (HOSPITAL_COMMUNITY): Payer: Self-pay | Admitting: Anesthesiology

## 2014-09-30 DIAGNOSIS — Z98891 History of uterine scar from previous surgery: Secondary | ICD-10-CM

## 2014-09-30 HISTORY — DX: History of uterine scar from previous surgery: Z98.891

## 2014-09-30 LAB — CBC
HEMATOCRIT: 29.8 % — AB (ref 36.0–46.0)
HEMOGLOBIN: 9.8 g/dL — AB (ref 12.0–15.0)
MCH: 29.8 pg (ref 26.0–34.0)
MCHC: 32.9 g/dL (ref 30.0–36.0)
MCV: 90.6 fL (ref 78.0–100.0)
Platelets: 124 10*3/uL — ABNORMAL LOW (ref 150–400)
RBC: 3.29 MIL/uL — ABNORMAL LOW (ref 3.87–5.11)
RDW: 14.4 % (ref 11.5–15.5)
WBC: 14.9 10*3/uL — ABNORMAL HIGH (ref 4.0–10.5)

## 2014-09-30 LAB — HIV ANTIBODY (ROUTINE TESTING W REFLEX): HIV Screen 4th Generation wRfx: NONREACTIVE

## 2014-09-30 LAB — RPR: RPR: NONREACTIVE

## 2014-09-30 SURGERY — Surgical Case
Anesthesia: Spinal | Site: Abdomen

## 2014-09-30 MED ORDER — KETOROLAC TROMETHAMINE 30 MG/ML IJ SOLN
30.0000 mg | Freq: Once | INTRAMUSCULAR | Status: AC | PRN
  Administered 2014-09-30: 30 mg via INTRAVENOUS

## 2014-09-30 MED ORDER — MORPHINE SULFATE (PF) 0.5 MG/ML IJ SOLN
INTRAMUSCULAR | Status: DC | PRN
  Administered 2014-09-30: .2 mg via INTRATHECAL

## 2014-09-30 MED ORDER — CEFAZOLIN SODIUM-DEXTROSE 2-3 GM-% IV SOLR
INTRAVENOUS | Status: DC | PRN
  Administered 2014-09-30: 2 g via INTRAVENOUS

## 2014-09-30 MED ORDER — 0.9 % SODIUM CHLORIDE (POUR BTL) OPTIME
TOPICAL | Status: DC | PRN
  Administered 2014-09-30: 1000 mL

## 2014-09-30 MED ORDER — HYDROMORPHONE HCL 1 MG/ML IJ SOLN: 0.2500 mg | INTRAMUSCULAR | Status: DC | PRN

## 2014-09-30 MED ORDER — KETOROLAC TROMETHAMINE 30 MG/ML IJ SOLN
INTRAMUSCULAR | Status: AC
  Filled 2014-09-30: qty 1

## 2014-09-30 MED ORDER — ACETAMINOPHEN 500 MG PO TABS
1000.0000 mg | ORAL_TABLET | Freq: Four times a day (QID) | ORAL | Status: AC
  Administered 2014-09-30 (×3): 1000 mg via ORAL
  Filled 2014-09-30 (×4): qty 2

## 2014-09-30 MED ORDER — MEPERIDINE HCL 25 MG/ML IJ SOLN
INTRAMUSCULAR | Status: AC
  Filled 2014-09-30: qty 1

## 2014-09-30 MED ORDER — ONDANSETRON HCL 4 MG/2ML IJ SOLN
INTRAMUSCULAR | Status: DC | PRN
  Administered 2014-09-30: 4 mg via INTRAVENOUS

## 2014-09-30 MED ORDER — OXYTOCIN 40 UNITS IN LACTATED RINGERS INFUSION - SIMPLE MED: 62.5000 mL/h | INTRAVENOUS | Status: DC

## 2014-09-30 MED ORDER — OXYCODONE-ACETAMINOPHEN 5-325 MG PO TABS
1.0000 | ORAL_TABLET | ORAL | Status: DC | PRN
Start: 2014-09-30 — End: 2014-10-03
  Filled 2014-09-30 (×2): qty 1

## 2014-09-30 MED ORDER — LANOLIN HYDROUS EX OINT: 1.0000 "application " | TOPICAL_OINTMENT | CUTANEOUS | Status: DC | PRN

## 2014-09-30 MED ORDER — PRENATAL MULTIVITAMIN CH
1.0000 | ORAL_TABLET | Freq: Every day | ORAL | Status: DC
  Administered 2014-09-30 – 2014-10-03 (×4): 1 via ORAL
  Filled 2014-09-30 (×4): qty 1

## 2014-09-30 MED ORDER — OXYCODONE-ACETAMINOPHEN 5-325 MG PO TABS
2.0000 | ORAL_TABLET | ORAL | Status: DC | PRN
Start: 2014-09-30 — End: 2014-10-03
  Administered 2014-10-01 – 2014-10-03 (×8): 2 via ORAL
  Filled 2014-09-30 (×7): qty 2

## 2014-09-30 MED ORDER — NALBUPHINE HCL 10 MG/ML IJ SOLN: 5.0000 mg | Freq: Once | INTRAMUSCULAR | Status: DC | PRN

## 2014-09-30 MED ORDER — SIMETHICONE 80 MG PO CHEW
80.0000 mg | CHEWABLE_TABLET | ORAL | Status: DC
  Administered 2014-09-30 – 2014-10-02 (×3): 80 mg via ORAL
  Filled 2014-09-30 (×3): qty 1

## 2014-09-30 MED ORDER — KETOROLAC TROMETHAMINE 30 MG/ML IJ SOLN: 30.0000 mg | Freq: Four times a day (QID) | INTRAMUSCULAR | Status: DC | PRN

## 2014-09-30 MED ORDER — ONDANSETRON HCL 4 MG/2ML IJ SOLN: 4.0000 mg | Freq: Three times a day (TID) | INTRAMUSCULAR | Status: DC | PRN

## 2014-09-30 MED ORDER — NALBUPHINE HCL 10 MG/ML IJ SOLN: 5.0000 mg | INTRAMUSCULAR | Status: DC | PRN

## 2014-09-30 MED ORDER — FENTANYL CITRATE (PF) 100 MCG/2ML IJ SOLN
INTRAMUSCULAR | Status: DC | PRN
  Administered 2014-09-30: 20 ug via INTRATHECAL

## 2014-09-30 MED ORDER — MORPHINE SULFATE (PF) 0.5 MG/ML IJ SOLN
INTRAMUSCULAR | Status: AC
  Filled 2014-09-30: qty 100

## 2014-09-30 MED ORDER — DIBUCAINE 1 % RE OINT: 1.0000 "application " | TOPICAL_OINTMENT | RECTAL | Status: DC | PRN

## 2014-09-30 MED ORDER — SENNOSIDES-DOCUSATE SODIUM 8.6-50 MG PO TABS
2.0000 | ORAL_TABLET | ORAL | Status: DC
  Administered 2014-09-30 – 2014-10-02 (×3): 2 via ORAL
  Filled 2014-09-30 (×3): qty 2

## 2014-09-30 MED ORDER — NALOXONE HCL 0.4 MG/ML IJ SOLN: 0.4000 mg | INTRAMUSCULAR | Status: DC | PRN

## 2014-09-30 MED ORDER — MENTHOL 3 MG MT LOZG: 1.0000 | LOZENGE | OROMUCOSAL | Status: DC | PRN

## 2014-09-30 MED ORDER — SIMETHICONE 80 MG PO CHEW
80.0000 mg | CHEWABLE_TABLET | Freq: Three times a day (TID) | ORAL | Status: DC
  Administered 2014-09-30 – 2014-10-03 (×11): 80 mg via ORAL
  Filled 2014-09-30 (×11): qty 1

## 2014-09-30 MED ORDER — MEPERIDINE HCL 25 MG/ML IJ SOLN
6.2500 mg | INTRAMUSCULAR | Status: DC | PRN
  Administered 2014-09-30 (×2): 12.5 mg via INTRAVENOUS

## 2014-09-30 MED ORDER — OXYTOCIN 10 UNIT/ML IJ SOLN
INTRAMUSCULAR | Status: AC
  Filled 2014-09-30: qty 4

## 2014-09-30 MED ORDER — MISOPROSTOL 200 MCG PO TABS
ORAL_TABLET | ORAL | Status: AC
  Filled 2014-09-30: qty 5

## 2014-09-30 MED ORDER — FENTANYL CITRATE (PF) 100 MCG/2ML IJ SOLN
INTRAMUSCULAR | Status: AC
  Filled 2014-09-30: qty 4

## 2014-09-30 MED ORDER — DIPHENHYDRAMINE HCL 25 MG PO CAPS: 25.0000 mg | ORAL_CAPSULE | ORAL | Status: DC | PRN

## 2014-09-30 MED ORDER — WITCH HAZEL-GLYCERIN EX PADS: 1.0000 "application " | MEDICATED_PAD | CUTANEOUS | Status: DC | PRN

## 2014-09-30 MED ORDER — DEXTROSE 5 % IV SOLN: 1.0000 ug/kg/h | INTRAVENOUS | Status: DC | PRN

## 2014-09-30 MED ORDER — IBUPROFEN 600 MG PO TABS: 600.0000 mg | ORAL_TABLET | Freq: Four times a day (QID) | ORAL | Status: DC | PRN

## 2014-09-30 MED ORDER — SCOPOLAMINE 1 MG/3DAYS TD PT72
MEDICATED_PATCH | TRANSDERMAL | Status: DC | PRN
  Administered 2014-09-30: 1 via TRANSDERMAL

## 2014-09-30 MED ORDER — BUPIVACAINE IN DEXTROSE 0.75-8.25 % IT SOLN
INTRATHECAL | Status: DC | PRN
  Administered 2014-09-30: 12 mg via INTRATHECAL

## 2014-09-30 MED ORDER — SIMETHICONE 80 MG PO CHEW: 80.0000 mg | CHEWABLE_TABLET | ORAL | Status: DC | PRN

## 2014-09-30 MED ORDER — LACTATED RINGERS IV SOLN
INTRAVENOUS | Status: DC | PRN
  Administered 2014-09-30: 03:00:00 via INTRAVENOUS

## 2014-09-30 MED ORDER — SODIUM CHLORIDE 0.9 % IJ SOLN: 3.0000 mL | INTRAMUSCULAR | Status: DC | PRN

## 2014-09-30 MED ORDER — DIPHENHYDRAMINE HCL 50 MG/ML IJ SOLN: 12.5000 mg | INTRAMUSCULAR | Status: DC | PRN

## 2014-09-30 MED ORDER — IBUPROFEN 800 MG PO TABS
800.0000 mg | ORAL_TABLET | Freq: Three times a day (TID) | ORAL | Status: DC
  Administered 2014-09-30 – 2014-10-03 (×10): 800 mg via ORAL
  Filled 2014-09-30 (×10): qty 1

## 2014-09-30 MED ORDER — PHENYLEPHRINE 8 MG IN D5W 100 ML (0.08MG/ML) PREMIX OPTIME
INJECTION | INTRAVENOUS | Status: DC | PRN
  Administered 2014-09-30: 45 ug/min via INTRAVENOUS

## 2014-09-30 MED ORDER — MEPERIDINE HCL 25 MG/ML IJ SOLN
6.2500 mg | INTRAMUSCULAR | Status: DC | PRN
Start: 2014-09-30 — End: 2014-09-30

## 2014-09-30 MED ORDER — ACETAMINOPHEN 325 MG PO TABS: 650.0000 mg | ORAL_TABLET | ORAL | Status: DC | PRN

## 2014-09-30 MED ORDER — SCOPOLAMINE 1 MG/3DAYS TD PT72: 1.0000 | MEDICATED_PATCH | Freq: Once | TRANSDERMAL | Status: DC

## 2014-09-30 MED ORDER — PROMETHAZINE HCL 25 MG/ML IJ SOLN: 6.2500 mg | INTRAMUSCULAR | Status: DC | PRN

## 2014-09-30 MED ORDER — LACTATED RINGERS IV SOLN
INTRAVENOUS | Status: DC
  Administered 2014-09-30: 10:00:00 via INTRAVENOUS

## 2014-09-30 MED ORDER — OXYTOCIN 10 UNIT/ML IJ SOLN
40.0000 [IU] | INTRAVENOUS | Status: DC | PRN
  Administered 2014-09-30: 40 [IU] via INTRAVENOUS

## 2014-09-30 MED ORDER — CEFAZOLIN SODIUM-DEXTROSE 2-3 GM-% IV SOLR
INTRAVENOUS | Status: AC
  Filled 2014-09-30: qty 50

## 2014-09-30 MED ORDER — PHENYLEPHRINE 8 MG IN D5W 100 ML (0.08MG/ML) PREMIX OPTIME
INJECTION | INTRAVENOUS | Status: AC
  Filled 2014-09-30: qty 100

## 2014-09-30 MED ORDER — DIPHENHYDRAMINE HCL 25 MG PO CAPS: 25.0000 mg | ORAL_CAPSULE | Freq: Four times a day (QID) | ORAL | Status: DC | PRN

## 2014-09-30 MED ORDER — TETANUS-DIPHTH-ACELL PERTUSSIS 5-2.5-18.5 LF-MCG/0.5 IM SUSP: 0.5000 mL | Freq: Once | INTRAMUSCULAR | Status: DC

## 2014-09-30 MED ORDER — ZOLPIDEM TARTRATE 5 MG PO TABS: 5.0000 mg | ORAL_TABLET | Freq: Every evening | ORAL | Status: DC | PRN

## 2014-09-30 MED ORDER — ONDANSETRON HCL 4 MG/2ML IJ SOLN
INTRAMUSCULAR | Status: AC
  Filled 2014-09-30: qty 2

## 2014-09-30 SURGICAL SUPPLY — 35 items
BENZOIN TINCTURE PRP APPL 2/3 (GAUZE/BANDAGES/DRESSINGS) ×3 IMPLANT
CLAMP CORD UMBIL (MISCELLANEOUS) IMPLANT
CLOSURE WOUND 1/2 X4 (GAUZE/BANDAGES/DRESSINGS) ×1
CLOTH BEACON ORANGE TIMEOUT ST (SAFETY) ×3 IMPLANT
CONTAINER PREFILL 10% NBF 15ML (MISCELLANEOUS) IMPLANT
DRAPE SHEET LG 3/4 BI-LAMINATE (DRAPES) IMPLANT
DRSG OPSITE POSTOP 4X10 (GAUZE/BANDAGES/DRESSINGS) ×3 IMPLANT
DURAPREP 26ML APPLICATOR (WOUND CARE) ×3 IMPLANT
ELECT REM PT RETURN 9FT ADLT (ELECTROSURGICAL) ×3
ELECTRODE REM PT RTRN 9FT ADLT (ELECTROSURGICAL) ×1 IMPLANT
EXTRACTOR VACUUM M CUP 4 TUBE (SUCTIONS) IMPLANT
EXTRACTOR VACUUM M CUP 4' TUBE (SUCTIONS)
GLOVE BIO SURGEON STRL SZ 6.5 (GLOVE) ×2 IMPLANT
GLOVE BIO SURGEONS STRL SZ 6.5 (GLOVE) ×1
GOWN STRL REUS W/TWL LRG LVL3 (GOWN DISPOSABLE) ×6 IMPLANT
KIT ABG SYR 3ML LUER SLIP (SYRINGE) IMPLANT
NEEDLE HYPO 25X5/8 SAFETYGLIDE (NEEDLE) IMPLANT
NS IRRIG 1000ML POUR BTL (IV SOLUTION) ×3 IMPLANT
PACK C SECTION WH (CUSTOM PROCEDURE TRAY) ×3 IMPLANT
PAD OB MATERNITY 4.3X12.25 (PERSONAL CARE ITEMS) ×3 IMPLANT
PENCIL SMOKE EVAC W/HOLSTER (ELECTROSURGICAL) IMPLANT
RTRCTR C-SECT PINK 25CM LRG (MISCELLANEOUS) ×3 IMPLANT
STRIP CLOSURE SKIN 1/2X4 (GAUZE/BANDAGES/DRESSINGS) ×2 IMPLANT
SUT MNCRL 0 VIOLET CTX 36 (SUTURE) ×2 IMPLANT
SUT MONOCRYL 0 CTX 36 (SUTURE) ×4
SUT PLAIN 1 NONE 54 (SUTURE) IMPLANT
SUT PLAIN 2 0 XLH (SUTURE) ×3 IMPLANT
SUT VIC AB 0 CT1 27 (SUTURE) ×4
SUT VIC AB 0 CT1 27XBRD ANBCTR (SUTURE) ×2 IMPLANT
SUT VIC AB 2-0 CT1 27 (SUTURE) ×2
SUT VIC AB 2-0 CT1 TAPERPNT 27 (SUTURE) ×1 IMPLANT
SUT VIC AB 4-0 KS 27 (SUTURE) ×3 IMPLANT
SYR BULB IRRIGATION 50ML (SYRINGE) ×3 IMPLANT
TOWEL OR 17X24 6PK STRL BLUE (TOWEL DISPOSABLE) ×3 IMPLANT
TRAY FOLEY CATH SILVER 14FR (SET/KITS/TRAYS/PACK) ×3 IMPLANT

## 2014-09-30 NOTE — Brief Op Note (Signed)
09/29/2014 - 09/30/2014  4:10 AM  PATIENT:  Kristen Gomez  19 y.o. female  PRE-OPERATIVE DIAGNOSIS:  Fetal Intolerance to Labor  POST-OPERATIVE DIAGNOSIS:  Fetal Intolerance to Labor  PROCEDURE:  Procedure(s): CESAREAN SECTION (N/A)  SURGEON:  Surgeon(s) and Role:    * Lucius Wise Bovard-Stuckert, MD - Primary  ANESTHESIA:   spinal  EBL:  Total I/O In: 2100 [I.V.:2100] Out: 1500 [Urine:700; Blood:800]  FINDINGS: viable female infant at 3:21, apgars and weight P,; nl uterus, tubes and ovaries.    BLOOD ADMINISTERED:none  DRAINS: Urinary Catheter (Foley)   LOCAL MEDICATIONS USED:  NONE  SPECIMEN:  Source of Specimen:  Placenta  DISPOSITION OF SPECIMEN:  L&D  COUNTS:  YES  TOURNIQUET:  * No tourniquets in log *  DICTATION: .Other Dictation: Dictation Number [id number]  PLAN OF CARE: Discharge to home after PACU  PATIENT DISPOSITION:  PACU - hemodynamically stable.   Delay start of Pharmacological VTE agent (>24hrs) due to surgical blood loss or risk of bleeding: not applicable

## 2014-09-30 NOTE — Anesthesia Postprocedure Evaluation (Signed)
  Anesthesia Post-op Note  Patient: Kristen Gomez  Procedure(s) Performed: Procedure(s): CESAREAN SECTION (N/A)  Patient Location: [facility] Unit  Anesthesia Type:Spinal  Level of Consciousness: awake, alert , oriented and patient cooperative  Airway and Oxygen Therapy: Patient Spontanous Breathing  Post-op Pain: mild  Post-op Assessment: Post-op Vital signs reviewed, Patient's Cardiovascular Status Stable, Respiratory Function Stable, Patent Airway, No signs of Nausea or vomiting, Adequate PO intake, Pain level controlled, No headache, No backache and Patient able to bend at knees LLE Motor Response: Purposeful movement LLE Sensation: Increased RLE Motor Response: Purposeful movement RLE Sensation: Increased      Post-op Vital Signs: Reviewed and stable  Last Vitals:  Filed Vitals:   09/30/14 1300  BP: 98/46  Pulse: 109  Temp: 36.8 C  Resp: 18    Complications: No apparent anesthesia complications

## 2014-09-30 NOTE — Lactation Note (Signed)
This note was copied from the chart of Kristen Kareen Jefferys. Lactation Consultation Note  Patient Name: Kristen Gomez [id number] Date: 09/30/2014 Reason for consult: Initial assessment;NICU baby Mom has DEBP set up, denies discomfort with pumping. LC reinforced need to pump every 3 hours for 15 minutes on preemie setting. Reviewed hand expression, no colostrum received. Reviewed labeling of EBM. NICU booklet gave to Mom and reviewed storage guidelines. Encouraged to call for questions/concerns.   Maternal Data Formula Feeding for Exclusion: No Has patient been taught Hand Expression?: Yes Does the patient have breastfeeding experience prior to this delivery?: No  Feeding    LATCH Score/Interventions                      Lactation Tools Discussed/Used Tools: Pump Breast pump type: Double-Electric Breast Pump   Consult Status Consult Status: Follow-up Date: 10/01/14 Follow-up type: In-patient    Alfred Levins 09/30/2014, 3:39 PM

## 2014-09-30 NOTE — Addendum Note (Signed)
Addendum  created 09/30/14 1357 by Collier Flowers, CRNA   Modules edited: Notes Section   Notes Section:  File: [id number]

## 2014-09-30 NOTE — Transfer of Care (Signed)
Immediate Anesthesia Transfer of Care Note  Patient: Kristen Gomez  Procedure(s) Performed: Procedure(s): CESAREAN SECTION (N/A)  Patient Location: PACU  Anesthesia Type:Spinal  Level of Consciousness: awake, alert , oriented and patient cooperative  Airway & Oxygen Therapy: Patient Spontanous Breathing  Post-op Assessment: Report given to RN and Post -op Vital signs reviewed and stable  Post vital signs: Reviewed and stable  Last Vitals:  Filed Vitals:   09/30/14 0231  BP: 100/40  Pulse: 132  Temp:   Resp: 18    Complications: No apparent anesthesia complications

## 2014-09-30 NOTE — Progress Notes (Signed)
Subjective: Postpartum Day 0: Cesarean Delivery Patient reports incisional pain and tolerating PO.    Objective: Vital signs in last 24 hours: Temp:  [97.6 F (36.4 C)-99.9 F (37.7 C)] 98.1 F (36.7 C) (09/14 0715) Pulse Rate:  [101-135] 115 (09/14 0715) Resp:  [13-24] 18 (09/14 0715) BP: (93-146)/(40-83) 120/60 mmHg (09/14 0715) SpO2:  [95 %-100 %] 95 % (09/14 0715) Weight:  [186 lb (84.369 kg)] 186 lb (84.369 kg) (09/13 1631)  Physical Exam:  General: alert and no distress Lochia: appropriate Uterine Fundus: firm Incision: healing well DVT Evaluation: No evidence of DVT seen on physical exam.   Recent Labs  09/29/14 1700 09/30/14 0717  HGB 13.9 9.8*  HCT 40.5 29.8*    Assessment/Plan: Status post Cesarean section. Doing well postoperatively.  Continue current care.  Baby in NICU.    Bovard-Stuckert, Keani Gotcher 09/30/2014, 7:40 AM

## 2014-09-30 NOTE — Anesthesia Preprocedure Evaluation (Addendum)
Anesthesia Evaluation  Patient identified by MRN, date of birth, ID band Patient awake    Reviewed: Allergy & Precautions, H&P , NPO status , Patient's Chart, lab work & pertinent test results  Airway Mallampati: III  TM Distance: >3 FB Neck ROM: full    Dental no notable dental hx.    Pulmonary neg pulmonary ROS,    Pulmonary exam normal        Cardiovascular negative cardio ROS Normal cardiovascular exam     Neuro/Psych negative psych ROS   GI/Hepatic negative GI ROS, Neg liver ROS,   Endo/Other  Morbid obesity  Renal/GU negative Renal ROS     Musculoskeletal   Abdominal (+) + obese,   Peds  Hematology negative hematology ROS (+)   Anesthesia Other Findings   Reproductive/Obstetrics (+) Pregnancy                             Anesthesia Physical Anesthesia Plan  ASA: III and emergent  Anesthesia Plan: Spinal   Post-op Pain Management:    Induction:   Airway Management Planned:   Additional Equipment:   Intra-op Plan:   Post-operative Plan:   Informed Consent: I have reviewed the patients History and Physical, chart, labs and discussed the procedure including the risks, benefits and alternatives for the proposed anesthesia with the patient or authorized representative who has indicated his/her understanding and acceptance.     Plan Discussed with: CRNA and Surgeon  Anesthesia Plan Comments:        Anesthesia Quick Evaluation

## 2014-09-30 NOTE — Anesthesia Procedure Notes (Signed)
Spinal Patient location during procedure: OR Start time: 09/30/2014 2:57 AM End time: 09/30/2014 2:59 AM Staffing Anesthesiologist: Leilani Able Performed by: anesthesiologist  Preanesthetic Checklist Completed: patient identified, surgical consent, pre-op evaluation, timeout performed, IV checked, risks and benefits discussed and monitors and equipment checked Spinal Block Patient position: sitting Prep: site prepped and draped and DuraPrep Patient monitoring: heart rate, cardiac monitor, continuous pulse ox and blood pressure Approach: midline Location: L3-4 Injection technique: single-shot Needle Needle type: Pencan  Needle gauge: 24 G Needle length: 9 cm Needle insertion depth: 6 cm Assessment Sensory level: T4

## 2014-09-30 NOTE — Anesthesia Postprocedure Evaluation (Signed)
Anesthesia Post Note  Patient: Kristen Gomez  Procedure(s) Performed: Procedure(s) (LRB): CESAREAN SECTION (N/A)  Anesthesia type: Spinal  Patient location: PACU  Post pain: Pain level controlled  Post assessment: Post-op Vital signs reviewed  Last Vitals:  Filed Vitals:   09/30/14 0445  BP: 97/53  Pulse: 107  Temp: 36.4 C  Resp: 14    Post vital signs: Reviewed  Level of consciousness: awake  Complications: No apparent anesthesia complications

## 2014-09-30 NOTE — Progress Notes (Signed)
Patient ID: Kristen Gomez, female   DOB: March 10, 1995, 19 y.o.   MRN: [id number]  Late entry  D/W pt pain management.    Also d/w pt late decels/non-reassuring FHT poss need for LTCS.  D/w pt r/b/a including but not limited to bleeding, infection, damage to surrounding organs, trouble healing, injury to infant.  Pt voices understanding.  Currently  Groggy with pain meds, but appropriate  AFVSS gen NAD FHTs 160's, mod var, variable decels toco irr coupling snd q 2-56min  amninfusuion has been started to help with variables

## 2014-09-30 NOTE — Progress Notes (Signed)
Patient ID: Kristen Gomez, female   DOB: 08-07-1995, 19 y.o.   MRN: [id number]  Pt uncomfortable with ctx  AFVSS gen NAD,sleepy, uncomfortable FHT's 160's, mod var, category 2, variable/late decels toco q 2-21min  SVE 2.3/70/-3 Little/no cervical change   D/w pt and family r/b/a of LTCS, also d/w pt remote from delivery and non-reassuring strip.  D/W pt risk of bleeding, infection, damage to surrounding organs, injury to baby, trouble with incision healing.  Also d/w pt risk of fetus having to go to NICU.  Pt and family voice understanding.

## 2014-10-01 ENCOUNTER — Encounter (HOSPITAL_COMMUNITY): Payer: Self-pay | Admitting: Obstetrics and Gynecology

## 2014-10-01 LAB — BIRTH TISSUE RECOVERY COLLECTION (PLACENTA DONATION)

## 2014-10-01 NOTE — Op Note (Signed)
NAME:  Kristen Gomez, Kristen Gomez NO.:  [id number]  MEDICAL RECORD NO.:  [id number]  LOCATION:  9318                          FACILITY:  [facility]  PHYSICIAN:  Sherron Monday, MD        DATE OF BIRTH:  [birth year]  DATE OF PROCEDURE:  09/30/2014 DATE OF DISCHARGE:                              OPERATIVE REPORT   PREOPERATIVE DIAGNOSIS:  Intrauterine pregnancy at term with fetal intolerance to labor.  Questionable arrest of dilatation.  POSTOPERATIVE DIAGNOSIS:  Intrauterine pregnancy at term with fetal intolerance to labor.  Questionable arrest of dilatation.  PROCEDURE:  Primary low-transverse cesarean section.  SURGEON:  Sherron Monday, MD.  ANESTHESIA:  Spinal.  IV FLUIDS:  2100 mL.  URINE OUTPUT:  700 mL.  ESTIMATED BLOOD LOSS:  Approximately 800 mL.  FINDINGS:  Viable female infant at 3:21 a.m.  Apgars and weight pending at the time of dictation.  Normal uterus, tubes, and ovaries are noted at the time of surgery.  PATHOLOGY:  Placenta to Labor and Delivery.  COMPLICATIONS:  None.  DESCRIPTION OF PROCEDURE:  After informed consent was reviewed with the patient and her family including the risks, benefits, and alternatives. She was transported to the operating room where spinal anesthesia was induced and found be adequate.  She was then returned to the supine position in a leftward tilt.  Once the level was found to be adequate, she was prepped and draped in the normal sterile fashion prior to appropriate time-out was performed.  A Pfannenstiel skin incision was made at the level of 2 fingerbreadths above the pubic symphysis and carried through the underlying layer of fascia sharply.  The fascia was incised in the midline.  The midline incision was extended laterally with Mayo scissors.  Superior aspect of the fascial incision was grasped with Kocher clamps, elevated, and the rectus muscles were dissected off both bluntly and sharply.  Midline was easily identified.   Peritoneum was entered bluntly.  Incision was extended superiorly and inferiorly with good visualization of the bladder.  Alexis skin retractor was placed carefully making sure no bowel was entrapped.  Uterus was incised in transverse fashion and the incision was extended manually, and the infant was delivered from a vertex presentation.  The nose and mouth were suctioned on the field.  The delayed cord clamping was attempted to be performed but infant seemed somewhat depressed.  The cord was clamped and cut.  Infant was handed off to the awaiting pediatric staff. Placenta was expressed from the uterus.  Uterus was cleared of all clot and debris.  The uterine incision was closed with 2 layers of 0 Monocryl the first of which is a running locked and the second as an imbricating layer.  Hemostasis was assured at the incision.  The gutters were cleared of all clots and debris.  The uterus, tubes, and ovaries were inspected and the Alexis retractor was removed.  The peritoneum and rectus muscles were reapproximated using 2-0 Vicryl.  The subfascial planes were inspected and found to be hemostatic.  The fascia was closed with 0 Vicryl in a running fashion.  The subcuticular adipose layer was made hemostatic with Bovie cautery.  The dead  space was closed with plain gut.  The skin was closed with 4-0 Vicryl on a Keith needle and benzoin and Steri-Strips were applied.  The patient tolerated the procedure well.  Sponge, lap, and needle counts were correct x2 per the operating room staff.     Sherron Monday, MD     JB/MEDQ  D:  09/30/2014  T:  10/01/2014  Job:  [id number]

## 2014-10-01 NOTE — Lactation Note (Signed)
This note was copied from the chart of Kristen Gomez. Lactation Consultation Note  Reviewed hand expression with mother and she was excited to express drops of colostrum.  Flange size changed to #27 . Encouragement given.  Patient Name: Kristen Gomez [id number] Date: 10/01/2014 Reason for consult: Initial assessment;NICU baby   Maternal Data Has patient been taught Hand Expression?: Yes  Feeding    LATCH Score/Interventions                      Lactation Tools Discussed/Used Pump Review: Setup, frequency, and cleaning;Milk Storage Initiated by:: RN Date initiated:: 09/30/14   Consult Status Consult Status: Follow-up Follow-up type: In-patient    Soyla Dryer 10/01/2014, 3:41 PM

## 2014-10-01 NOTE — Progress Notes (Signed)
CLINICAL SOCIAL WORK MATERNAL/CHILD NOTE  Patient Details  Name: Kristen Gomez MRN: [id number] Date of Birth: 09/30/2016  Date:  10/01/2014  Clinical Social Worker Initiating Note:  Almarie Kurdziel E. Neftali Abair, LCSW Date/ Time Initiated:  10/01/14/1230     Child's Name:  Kristen Gomez   Legal Guardian:   (Parents: Kristen Gomez and Kristen Gomez)   Need for Interpreter:  None   Date of Referral:  10/01/14     Reason for Referral:   (No referral-NICU admission)   Referral Source:      Address:  [address] [place],  [address]  Phone number:  [phone]   Household Members:  Significant Other   Natural Supports (not living in the home):  Immediate Family (MOB states, "basically both of our families are very supportive.")   Professional Supports:     Employment:     Type of Work:  (MOB was working at [employer], but plans to seek other employment at some point.  FOB works at a warehouse.)   Education:      Financial Resources:  Medicaid   Other Resources:      Cultural/Religious Considerations Which May Impact Care: None stated  Strengths:  Ability to meet basic needs , Compliance with medical plan , Home prepared for child , Understanding of illness (MOB plans to take baby to [facility] for follow up.  She has not contacted them.  CSW advised she call and confirm that they are accepting new patients with Medicaid.)   Risk Factors/Current Problems:   (MOB's chart notes a hx of Anxiety)   Cognitive State:  Alert , Insightful , Goal Oriented    Mood/Affect:  Calm , Interested    CSW Assessment: CSW met with MOB in her third floor room to introduce services, offer support, and complete assessment due to baby's admission to NICU at 39.6 weeks.  MOB was pleasant and receptive to CSW intervention.  She reports that she and baby are doing well and that baby is already making a lot of progress, for which she is thankful.  She states she felt scared  when going for her c-section, but that her doctor kept her calm throughout the whole thing.  She states she did not mind having a c-section, because she "just wanted it over with."  She reports feeling well informed by NICU staff.  She states understanding that baby may need to remain in the hospital past her own discharge and states no transportation needs in that case.  She reports she and FOB are in a relationship and that they live together.  This is the first child for both of them and MOB states that everyone is excited about the baby.  She reports having good supports from her family as well as FOB's.  She reports having everything needed for baby at home, including a crib and a bassinet.   CSW provided education on SIDS precautions.  MOB states she was not familiar with SIDS.  She commits to putting baby to bed in his own sleep environment 100% of the time.  She states FOB smokes, but not in the home and knows that he needs to wash his hands and change his clothes prior to holding baby.   CSW discussed common emotions often experienced in the first two weeks after delivery as well as signs and symptoms of perinatal mood disorders.  MOB was engaged and attentive.  She states a commitment to talking with CSW and or her doctor if concerns   arise.  She states no current emotional concerns.  CSW asked if she has a history of Anx/Dep, as Anxiety is noted in her medical record.  MOB states she does not think so, although she gets chest pains, which she has been told is due to Anxiety.  CSW reviewed coping skills useful in dealing with Anxiety.   CSW provided contact information and ongoing support services offered by NICU CSW.  MOB appeared appreciative of the visit.  CSW Plan/Description:  Patient/Family Education , Psychosocial Support and Ongoing Assessment of Needs    Kalman Shan November 17, 2014, 2:33 PM

## 2014-10-01 NOTE — Progress Notes (Signed)
Subjective: Postpartum Day 1: Cesarean Delivery Patient reports incisional pain and tolerating PO  Nl lochia, pain controlled.    Objective: Vital signs in last 24 hours: Temp:  [97.8 F (36.6 C)-98.9 F (37.2 C)] 97.9 F (36.6 C) (09/15 0527) Pulse Rate:  [102-120] 102 (09/15 0527) Resp:  [18] 18 (09/15 0527) BP: (90-120)/(41-60) 95/55 mmHg (09/15 0527) SpO2:  [94 %-100 %] 100 % (09/15 0527)  Physical Exam:  General: alert and no distress Lochia: appropriate Uterine Fundus: firm Incision: healing well DVT Evaluation: No evidence of DVT seen on physical exam.   Recent Labs  09/29/14 1700 09/30/14 0717  HGB 13.9 9.8*  HCT 40.5 29.8*    Assessment/Plan: Status post Cesarean section. Doing well postoperatively.  Continue current care.  Baby in NICU on abx  Bovard-Stuckert, Feather Berrie 10/01/2014, 6:28 AM

## 2014-10-01 NOTE — Progress Notes (Signed)
Introduced myself to pt and explained spiritual care services.  Pt states she is a little sad that her daughter, Andi Devon (pronounced Milan) is not with her but states she knows that she is where she needs to be and is glad that she can visit her when she wants.  Mother states she thinks she will go home on Saturday and that NICU staff has given her some hope that her daughter may be able to go home on Saturday too.  Oneita Hurt, chaplain   10/01/14 1200  Clinical Encounter Type  Visited With Patient  Visit Type Initial  Spiritual Encounters  Spiritual Needs Emotional

## 2014-10-02 NOTE — Lactation Note (Addendum)
This note was copied from the chart of Kristen Gomez. Lactation Consultation Note  Patient Name: Kristen Gomez [id number] Date: 10/02/2014 Reason for consult: Follow-up assessment   With this mom of a NICU baby, full term, needing resuscitation  at birtht, now doing well. I reviewed hand expression with mom, now at 55 hours post partum. She is expressing tiny drops of colostrum.I decreased mom ot size 21 flanges with a good fit.  I told mom she should try and latch the baby to her breast, while the baby is in the NICU, and to have her nurse call for me to come and assist her with latching.Mom  Skin to skin encouraged. Mom is active with WIC, and will be discharged to home tomorrow, on the W/E, so will need to do a WIC DEP loaner. I did send a fax to [facility] Mom knows to call for questions/concerns.   Maternal Data    Feeding Feeding Type: Formula Nipple Type: Regular Length of feed: 30 min  LATCH Score/Interventions                      Lactation Tools Discussed/Used Tools: Bottle WIC Program: Yes (fax sent for DEP) Pump Review: Setup, frequency, and cleaning;Milk Storage;Other (comment) (premie setting, hand expression, review of NICU book on ppumping) Initiated by:: bedside RN Date initiated:: 09/30/14   Consult Status Consult Status: Follow-up Date: 10/03/14 Follow-up type: In-patient    Alfred Levins 10/02/2014, 3:20 PM

## 2014-10-02 NOTE — Progress Notes (Signed)
Subjective: Postpartum Day 2: Cesarean Delivery Patient reports incisional pain, tolerating PO and no problems voiding.  Nl lochia, pain controlled Objective: Vital signs in last 24 hours: Temp:  [97.6 F (36.4 C)-98.6 F (37 C)] 98.4 F (36.9 C) (09/16 0629) Pulse Rate:  [88-113] 88 (09/16 0629) Resp:  [18-20] 18 (09/16 0629) BP: (107-117)/(57-73) 113/73 mmHg (09/16 0629) SpO2:  [100 %] 100 % (09/15 1830)  Physical Exam:  General: alert and no distress Lochia: appropriate Uterine Fundus: firm   Recent Labs  09/29/14 1700 09/30/14 0717  HGB 13.9 9.8*  HCT 40.5 29.8*    Assessment/Plan: Status post Cesarean section. Doing well postoperatively.  Continue current care.  Baby RA, eating ad lib, abx.    Bovard-Stuckert, Jody 10/02/2014, 8:02 AM

## 2014-10-03 MED ORDER — OXYCODONE-ACETAMINOPHEN 5-325 MG PO TABS: 1.0000 | ORAL_TABLET | ORAL | Status: DC | PRN

## 2014-10-03 MED ORDER — IBUPROFEN 600 MG PO TABS: 600.0000 mg | ORAL_TABLET | Freq: Four times a day (QID) | ORAL | Status: DC | PRN

## 2014-10-03 MED ORDER — PRENATAL MULTIVITAMIN CH: 1.0000 | ORAL_TABLET | Freq: Every day | ORAL | Status: DC

## 2014-10-03 NOTE — Progress Notes (Signed)
Subjective: Postpartum Day 3: Cesarean Delivery Patient reports tolerating PO, + flatus and no problems voiding.    Objective: Vital signs in last 24 hours: Temp:  [97.6 F (36.4 C)-98.2 F (36.8 C)] 98.2 F (36.8 C) (09/17 0542) Pulse Rate:  [72-88] 88 (09/17 0542) Resp:  [18] 18 (09/17 0542) BP: (104-119)/(60-78) 119/78 mmHg (09/17 0542) SpO2:  [100 %] 100 % (09/17 0542)  Physical Exam:  General: alert, cooperative and no distress Lochia: appropriate Uterine Fundus: firm Incision: healing well, no significant drainage DVT Evaluation: No significant calf/ankle edema.  No results for input(s): HGB, HCT in the last 72 hours.  Assessment/Plan: Status post Cesarean section. Doing well postoperatively.  Discharge home with standard precautions and return to clinic in 2-6 weeks.  Mare Loan Worema Banga 10/03/2014, 11:49 AM

## 2014-10-03 NOTE — Discharge Instructions (Signed)
Avoid driving for at least 1-2 weeks or until off narcotic pain meds.  No heavy lifting greater than 10 lbs.  Nothing in vagina for 6 weeks.  May remove bandage in 1-2 days.  Shower over incision and pat dry. ° °

## 2014-10-03 NOTE — Discharge Summary (Signed)
Obstetric Discharge Summary Reason for Admission: induction of labor and due fetal HR deceleration and concerning BPP 6/8 Prenatal Procedures: NST and ultrasound Intrapartum Procedures: cesarean: low cervical, transverse Postpartum Procedures: none Complications-Operative and Postpartum: none HEMOGLOBIN  Date Value Ref Range Status  09/30/2014 9.8* 12.0 - 15.0 g/dL Final    Comment:    DELTA CHECK NOTED REPEATED TO VERIFY    HCT  Date Value Ref Range Status  09/30/2014 29.8* 36.0 - 46.0 % Final    Physical Exam:  General: alert, cooperative and no distress Lochia: appropriate Uterine Fundus: firm Incision: healing well, no significant drainage, dressing c/d/i DVT Evaluation: No significant calf/ankle edema.  Discharge Diagnoses: Term Pregnancy-delivered  Discharge Information: Date: 10/03/2014 Activity: pelvic rest Diet: routine Medications: PNV, Ibuprofen and Percocet Condition: stable Instructions: refer to practice specific booklet Discharge to: home Follow-up Information    Follow up with Bovard-Stuckert, Augusto Gamble, MD In 2 weeks.   Specialty:  Obstetrics and Gynecology   Why:  For incision check and 6weeks for postpartum visit   Contact information:   [address] [place] [address] [phone]       Newborn Data: Live born female  Birth Weight: 8 lb 0.8 oz (3650 g) APGAR: 1, 5  Home with baby in NICU-doing well.  Mare Loan Worema Banga 10/03/2014, 11:55 AM

## 2014-10-03 NOTE — Progress Notes (Signed)
Discharge teaching complete. Pt understood all information and did not have any questions. Pt ambulated out of the hospital and discharged home to family.  

## 2014-10-03 NOTE — Lactation Note (Signed)
This note was copied from the chart of Kristen Frederick Klinger. Lactation Consultation Note; Mom reports she pumped 5 times yesterday and twice so far today. Reports she is obtaining a few drops of Colostrum each time. Was able to put baby to the breast yesterday and she latched well but then fell asleep. Encouraged to continue putting baby to the breast as able. Has [facility] loaner for home. No questions at present. To call prn  Patient Name: Kristen Gomez [id number] Date: 10/03/2014 Reason for consult: Follow-up assessment;NICU baby   Maternal Data Formula Feeding for Exclusion: No Has patient been taught Hand Expression?: Yes Does the patient have breastfeeding experience prior to this delivery?: No  Feeding    LATCH Score/Interventions                      Lactation Tools Discussed/Used WIC Program: Yes (has [facility] loaner for home)   Consult Status Consult Status: Complete    Pamelia Hoit 10/03/2014, 10:42 AM

## 2014-10-09 ENCOUNTER — Inpatient Hospital Stay (HOSPITAL_COMMUNITY): Admission: RE | Admit: 2014-10-09 | Payer: No Typology Code available for payment source | Source: Ambulatory Visit

## 2014-10-28 ENCOUNTER — Encounter (INDEPENDENT_AMBULATORY_CARE_PROVIDER_SITE_OTHER): Payer: Self-pay

## 2015-05-11 ENCOUNTER — Emergency Department (HOSPITAL_COMMUNITY): Payer: Medicaid Other

## 2015-05-11 ENCOUNTER — Emergency Department (HOSPITAL_COMMUNITY)
Admission: EM | Admit: 2015-05-11 | Discharge: 2015-05-11 | Disposition: A | Discharge status: Home / Self Care | Payer: Medicaid Other | Attending: Emergency Medicine | Admitting: Emergency Medicine

## 2015-05-11 ENCOUNTER — Encounter (HOSPITAL_COMMUNITY): Payer: Self-pay | Admitting: Emergency Medicine

## 2015-05-11 DIAGNOSIS — R0789 Other chest pain: Secondary | ICD-10-CM | POA: Insufficient documentation

## 2015-05-11 DIAGNOSIS — R0602 Shortness of breath: Secondary | ICD-10-CM | POA: Insufficient documentation

## 2015-05-11 DIAGNOSIS — Z79899 Other long term (current) drug therapy: Secondary | ICD-10-CM | POA: Insufficient documentation

## 2015-05-11 DIAGNOSIS — Z87440 Personal history of urinary (tract) infections: Secondary | ICD-10-CM | POA: Insufficient documentation

## 2015-05-11 DIAGNOSIS — R079 Chest pain, unspecified: Secondary | ICD-10-CM

## 2015-05-11 DIAGNOSIS — Z8659 Personal history of other mental and behavioral disorders: Secondary | ICD-10-CM | POA: Diagnosis not present

## 2015-05-11 LAB — D-DIMER, QUANTITATIVE (NOT AT ARMC)

## 2015-05-11 LAB — CBC WITH DIFFERENTIAL/PLATELET
BASOS ABS: 0 10*3/uL (ref 0.0–0.1)
Basophils Relative: 1 %
EOS PCT: 5 %
Eosinophils Absolute: 0.4 10*3/uL (ref 0.0–0.7)
HEMATOCRIT: 38.1 % (ref 36.0–46.0)
HEMOGLOBIN: 12.5 g/dL (ref 12.0–15.0)
LYMPHS ABS: 2.8 10*3/uL (ref 0.7–4.0)
LYMPHS PCT: 31 %
MCH: 28.8 pg (ref 26.0–34.0)
MCHC: 32.8 g/dL (ref 30.0–36.0)
MCV: 87.8 fL (ref 78.0–100.0)
Monocytes Absolute: 0.5 10*3/uL (ref 0.1–1.0)
Monocytes Relative: 6 %
NEUTROS ABS: 5.2 10*3/uL (ref 1.7–7.7)
Neutrophils Relative %: 57 %
PLATELETS: 268 10*3/uL (ref 150–400)
RBC: 4.34 MIL/uL (ref 3.87–5.11)
RDW: 13.1 % (ref 11.5–15.5)
WBC: 8.9 10*3/uL (ref 4.0–10.5)

## 2015-05-11 LAB — BASIC METABOLIC PANEL
Anion gap: 10 (ref 5–15)
BUN: 7 mg/dL (ref 6–20)
CHLORIDE: 103 mmol/L (ref 101–111)
CO2: 24 mmol/L (ref 22–32)
CREATININE: 0.72 mg/dL (ref 0.44–1.00)
Calcium: 9.3 mg/dL (ref 8.9–10.3)
GFR calc non Af Amer: >60 mL/min (ref >60)
Glucose, Bld: 80 mg/dL (ref 65–99)
Potassium: 3.6 mmol/L (ref 3.5–5.1)
SODIUM: 137 mmol/L (ref 135–145)

## 2015-05-11 LAB — I-STAT TROPONIN, ED: Troponin i, poc: 0 ng/mL (ref 0.00–0.08)

## 2015-05-11 MED ORDER — KETOROLAC TROMETHAMINE 30 MG/ML IJ SOLN
30.0000 mg | Freq: Once | INTRAMUSCULAR | Status: AC
  Administered 2015-05-11: 30 mg via INTRAVENOUS
  Filled 2015-05-11: qty 1

## 2015-05-11 MED ORDER — SODIUM CHLORIDE 0.9 % IV BOLUS (SEPSIS)
1000.0000 mL | Freq: Once | INTRAVENOUS | Status: AC
  Administered 2015-05-11: 1000 mL via INTRAVENOUS

## 2015-05-11 MED ORDER — IBUPROFEN 600 MG PO TABS: 600.0000 mg | ORAL_TABLET | Freq: Three times a day (TID) | ORAL | Status: DC | PRN

## 2015-05-11 NOTE — ED Provider Notes (Signed)
CSN: [id number]     Arrival date & time 05/11/15  1350 History   First MD Initiated Contact with Patient 05/11/15 1953     Chief Complaint  Patient presents with  . Chest Pain     (Consider location/radiation/quality/duration/timing/severity/associated sxs/prior Treatment) HPI 20 year old female presents with chest pain. Patient states she's had chest pain like this for over one year. Pain comes and goes, at least twice per week. She was seen in the ER when it originally started and told it was an inflamed chest. However over the last 3 days it has been worse. The pain comes and goes. Pain feels like a pressure on her chest. Some associated shortness of breath. No vomiting or nausea. Pain is in the middle of her chest, occasionally moves to the right side of her chest. No back pain. Takes ibuprofen without relief. Denies any calf pain or swelling. Pain is currently severe. Nothing specific causes pain to come or go.  Past Medical History  Diagnosis Date  . Anxiety   . Headache   . UTI (urinary tract infection)   . S/P cesarean section 09/30/2014   Past Surgical History  Procedure Laterality Date  . Tonsillectomy    . Cesarean section N/A 09/30/2014    Procedure: CESAREAN SECTION;  Surgeon: Sherian ReinJody Bovard-Stuckert, MD;  Location: [facility];  Service: Obstetrics;  Laterality: N/A;   History reviewed. No pertinent family history. Social History  Substance Use Topics  . Smoking status: Never Smoker   . Smokeless tobacco: None  . Alcohol Use: No   OB History    Gravida Para Term Preterm AB TAB SAB Ectopic Multiple Living   1 1 1       0 1     Review of Systems  Constitutional: Negative for fever.  Respiratory: Positive for shortness of breath. Negative for cough.   Cardiovascular: Positive for chest pain. Negative for leg swelling.  Gastrointestinal: Negative for nausea and vomiting.  All other systems reviewed and are negative.     Allergies  Review of patient's allergies  indicates no known allergies.  Home Medications   Prior to Admission medications   Medication Sig Start Date End Date Taking? Authorizing Provider  ibuprofen (ADVIL,MOTRIN) 600 MG tablet Take 1 tablet (600 mg total) by mouth every 6 (six) hours as needed for mild pain. 10/03/14  Yes Cecilia Worema Banga, DO  norgestimate-ethinyl estradiol (ORTHO-CYCLEN,SPRINTEC,PREVIFEM) 0.25-35 MG-MCG tablet Take 1 tablet by mouth daily.   Yes Historical Provider, MD   BP 100/73 mmHg  Pulse 63  Temp(Src) 98.5 F (36.9 C) (Oral)  Resp 23  SpO2 84%  LMP 04/27/2015 Physical Exam  Constitutional: She is oriented to person, place, and time. She appears well-developed and well-nourished.  HENT:  Head: Normocephalic and atraumatic.  Right Ear: External ear normal.  Left Ear: External ear normal.  Nose: Nose normal.  Eyes: Right eye exhibits no discharge. Left eye exhibits no discharge.  Cardiovascular: Normal rate, regular rhythm and normal heart sounds.   Pulmonary/Chest: Effort normal and breath sounds normal. She exhibits tenderness.    Abdominal: Soft. There is no tenderness.  Neurological: She is alert and oriented to person, place, and time.  Skin: Skin is warm and dry.  Nursing note and vitals reviewed.   ED Course  Procedures (including critical care time) Labs Review Labs Reviewed  D-DIMER, QUANTITATIVE (NOT AT [facility])  BASIC METABOLIC PANEL  CBC WITH DIFFERENTIAL/PLATELET  Rosezena SensorI-STAT TROPOININ, ED    Imaging Review Dg Chest 2 View  05/11/2015  CLINICAL DATA:  Intermittent chest pain for 1 year EXAM: CHEST  2 VIEW COMPARISON:  11/17/2013 FINDINGS: The heart size and mediastinal contours are within normal limits. Both lungs are clear. The visualized skeletal structures are unremarkable. IMPRESSION: No active cardiopulmonary disease. Electronically Signed   By: Alcide Clever M.D.   On: 05/11/2015 21:38   I have personally reviewed and evaluated these images and lab results as part of my  medical decision-making.   EKG Interpretation   Date/Time:  Tuesday May 11 2015 13:58:27 EDT Ventricular Rate:  96 PR Interval:  144 QRS Duration: 72 QT Interval:  346 QTC Calculation: 437 R Axis:   47 Text Interpretation:  Normal sinus rhythm Nonspecific ST abnormality  Abnormal ECG no significant change since 2015 Confirmed by Tamsen Reist  MD,  Waylyn Tenbrink ([id number]) on 05/11/2015 7:27:34 PM      MDM   Final diagnoses:  Nonspecific chest pain    No clear etiology for the patient's chronic chest pain. Low suspicion for PE, ACS, or dissection. Likely musculoskeletal given the reproducible nature and chronicity. Take ibuprofen and Tylenol and follow up with a PCP.    Pricilla Loveless, MD 05/12/15 [phone]

## 2015-05-11 NOTE — ED Notes (Signed)
Pt reports chest pains over the last year that come and go. Reports feeling her chest is tight. Denies recent cough, fever or asthma hx. Pt reports feeling like shes having a panic attack when the pain gets worse. Pt alert, oriented x4, VSS.

## 2015-12-29 ENCOUNTER — Emergency Department (HOSPITAL_COMMUNITY)
Admission: EM | Admit: 2015-12-29 | Discharge: 2015-12-29 | Disposition: A | Discharge status: Home / Self Care | Payer: No Typology Code available for payment source | Attending: Emergency Medicine | Admitting: Emergency Medicine

## 2015-12-29 ENCOUNTER — Encounter (HOSPITAL_COMMUNITY): Payer: Self-pay | Admitting: Nurse Practitioner

## 2015-12-29 DIAGNOSIS — N12 Tubulo-interstitial nephritis, not specified as acute or chronic: Secondary | ICD-10-CM | POA: Insufficient documentation

## 2015-12-29 LAB — CBC WITH DIFFERENTIAL/PLATELET
Basophils Absolute: 0 10*3/uL (ref 0.0–0.1)
Basophils Relative: 0 %
Eosinophils Absolute: 0 10*3/uL (ref 0.0–0.7)
Eosinophils Relative: 0 %
HCT: 37.2 % (ref 36.0–46.0)
Hemoglobin: 12.2 g/dL (ref 12.0–15.0)
Lymphocytes Relative: 4 %
Lymphs Abs: 0.7 10*3/uL (ref 0.7–4.0)
MCH: 29.3 pg (ref 26.0–34.0)
MCHC: 32.8 g/dL (ref 30.0–36.0)
MCV: 89.4 fL (ref 78.0–100.0)
Monocytes Absolute: 1.6 10*3/uL — ABNORMAL HIGH (ref 0.1–1.0)
Monocytes Relative: 8 %
Neutro Abs: 16.9 10*3/uL — ABNORMAL HIGH (ref 1.7–7.7)
Neutrophils Relative %: 88 %
Platelets: 197 10*3/uL (ref 150–400)
RBC: 4.16 MIL/uL (ref 3.87–5.11)
RDW: 13.2 % (ref 11.5–15.5)
WBC: 19.2 10*3/uL — ABNORMAL HIGH (ref 4.0–10.5)

## 2015-12-29 LAB — URINALYSIS, ROUTINE W REFLEX MICROSCOPIC
Bilirubin Urine: NEGATIVE
Glucose, UA: NEGATIVE mg/dL
Ketones, ur: 80 mg/dL — AB
Nitrite: POSITIVE — AB
Protein, ur: 100 mg/dL — AB
Specific Gravity, Urine: 1.014 (ref 1.005–1.030)
pH: 5 (ref 5.0–8.0)

## 2015-12-29 LAB — COMPREHENSIVE METABOLIC PANEL
ALT: 14 U/L (ref 14–54)
AST: 18 U/L (ref 15–41)
Albumin: 3.2 g/dL — ABNORMAL LOW (ref 3.5–5.0)
Alkaline Phosphatase: 78 U/L (ref 38–126)
Anion gap: 10 (ref 5–15)
BUN: 5 mg/dL — ABNORMAL LOW (ref 6–20)
CO2: 22 mmol/L (ref 22–32)
Calcium: 8.7 mg/dL — ABNORMAL LOW (ref 8.9–10.3)
Chloride: 105 mmol/L (ref 101–111)
Creatinine, Ser: 1.03 mg/dL — ABNORMAL HIGH (ref 0.44–1.00)
GFR calc Af Amer: >60 mL/min (ref >60)
GFR calc non Af Amer: >60 mL/min (ref >60)
Glucose, Bld: 140 mg/dL — ABNORMAL HIGH (ref 65–99)
Potassium: 3.4 mmol/L — ABNORMAL LOW (ref 3.5–5.1)
Sodium: 137 mmol/L (ref 135–145)
Total Bilirubin: 1 mg/dL (ref 0.3–1.2)
Total Protein: 6.9 g/dL (ref 6.5–8.1)

## 2015-12-29 LAB — I-STAT BETA HCG BLOOD, ED (MC, WL, AP ONLY): I-stat hCG, quantitative: <5 m[IU]/mL (ref <5)

## 2015-12-29 LAB — I-STAT CG4 LACTIC ACID, ED: Lactic Acid, Venous: 1.51 mmol/L (ref 0.5–1.9)

## 2015-12-29 MED ORDER — ACETAMINOPHEN 325 MG PO TABS
650.0000 mg | ORAL_TABLET | Freq: Once | ORAL | Status: AC
  Administered 2015-12-29: 650 mg via ORAL

## 2015-12-29 MED ORDER — CEFTRIAXONE SODIUM 1 G IJ SOLR
1.0000 g | Freq: Once | INTRAMUSCULAR | Status: AC
  Administered 2015-12-29: 1 g via INTRAVENOUS
  Filled 2015-12-29: qty 10

## 2015-12-29 MED ORDER — ACETAMINOPHEN 325 MG PO TABS
ORAL_TABLET | ORAL | Status: AC
  Filled 2015-12-29: qty 2

## 2015-12-29 MED ORDER — MORPHINE SULFATE (PF) 4 MG/ML IV SOLN
4.0000 mg | Freq: Once | INTRAVENOUS | Status: AC
  Administered 2015-12-29: 4 mg via INTRAVENOUS
  Filled 2015-12-29: qty 1

## 2015-12-29 MED ORDER — ONDANSETRON HCL 4 MG PO TABS: 4.0000 mg | ORAL_TABLET | Freq: Four times a day (QID) | ORAL | 0 refills | Status: DC

## 2015-12-29 MED ORDER — CEPHALEXIN 500 MG PO CAPS: 500.0000 mg | ORAL_CAPSULE | Freq: Four times a day (QID) | ORAL | 0 refills | Status: DC

## 2015-12-29 MED ORDER — SODIUM CHLORIDE 0.9 % IV BOLUS (SEPSIS)
1000.0000 mL | Freq: Once | INTRAVENOUS | Status: AC
  Administered 2015-12-29: 1000 mL via INTRAVENOUS

## 2015-12-29 MED ORDER — KETOROLAC TROMETHAMINE 15 MG/ML IJ SOLN
15.0000 mg | Freq: Once | INTRAMUSCULAR | Status: AC
  Administered 2015-12-29: 15 mg via INTRAVENOUS
  Filled 2015-12-29: qty 1

## 2015-12-29 NOTE — ED Provider Notes (Signed)
[facility]-EMERGENCY DEPT Provider Note   CSN: [id number] Arrival date & time: 12/29/15  1529  By signing my name below, I, Rosario AdieWilliam Andrew Hiatt, attest that this documentation has been prepared under the direction and in the presence of Raeford RazorStephen Nell Schrack, MD. Electronically Signed: Rosario AdieWilliam Andrew Hiatt, ED Scribe. 12/29/15. 5:34 PM.  History   Chief Complaint Chief Complaint  Patient presents with  . Fever   The history is provided by the patient. No language interpreter was used.    HPI Comments: Kristen Gomez is a 20 y.o. female with no pertinent PMHx, who presents to the Emergency Department complaining of waxing and waning fever (febrile in the ED at 101.1) onset approximately 3 days ago. Pt reports associated generalized myalgias, fatigue, diffuse, throbbing headache, chills, nausea, and vomiting secondary to the onset of her fever. She additionally notes that she has had right-sided flank pain and mild shortness of breath which both began today as well. Her flank pain is exacerbated with deep inspirations. Her nausea and vomiting is exacerbated with food/fluid intake. Pt has been taking Theraflu, Motrin, Dayquil, and Nyquil with minimal relief of her symptoms. No sick contacts with similar symptoms. She denies sore throat, diarrhea, urgency, frequency, hematuria, dysuria, difficulty urinating, or any other associated symptoms.   Past Medical History:  Diagnosis Date  . Anxiety   . Headache   . S/P cesarean section 09/30/2014  . UTI (urinary tract infection)    Patient Active Problem List   Diagnosis Date Noted  . S/P cesarean section 09/30/2014  . Labor and delivery, indication for care 09/29/2014   Past Surgical History:  Procedure Laterality Date  . CESAREAN SECTION N/A 09/30/2014   Procedure: CESAREAN SECTION;  Surgeon: Sherian ReinJody Bovard-Stuckert, MD;  Location: [facility];  Service: Obstetrics;  Laterality: N/A;  . TONSILLECTOMY     OB History    Gravida Para Term Preterm AB Living   1  1 1     1    SAB TAB Ectopic Multiple Live Births         0 1     Home Medications    Prior to Admission medications   Medication Sig Start Date End Date Taking? Authorizing Provider  ibuprofen (ADVIL,MOTRIN) 600 MG tablet Take 1 tablet (600 mg total) by mouth every 8 (eight) hours as needed. 05/11/15   Pricilla LovelessScott Goldston, MD  norgestimate-ethinyl estradiol (ORTHO-CYCLEN,SPRINTEC,PREVIFEM) 0.25-35 MG-MCG tablet Take 1 tablet by mouth daily.    Historical Provider, MD    Family History History reviewed. No pertinent family history.  Social History Social History  Substance Use Topics  . Smoking status: Never Smoker  . Smokeless tobacco: Not on file  . Alcohol use No   Allergies   Patient has no known allergies.  Review of Systems Review of Systems  Constitutional: Positive for chills, fatigue and fever (febrile in the ED at 101.1).  HENT: Negative for sore throat.   Respiratory: Positive for shortness of breath (mild).   Gastrointestinal: Positive for nausea and vomiting. Negative for diarrhea.  Genitourinary: Positive for flank pain (right). Negative for difficulty urinating, dysuria, frequency, hematuria and urgency.  Musculoskeletal: Positive for myalgias (generalized).  Neurological: Positive for headaches.  All other systems reviewed and are negative.  Physical Exam Updated Vital Signs BP 102/55 (BP Location: Right Arm)   Pulse (!) 142   Temp 101.1 F (38.4 C) (Oral)   Resp 20   Ht 5' (1.524 m)   Wt 150 lb (68 kg)   SpO2 99%  BMI 29.29 kg/m   Physical Exam  Constitutional: She appears well-developed and well-nourished.  HENT:  Head: Normocephalic.  Right Ear: External ear normal.  Left Ear: External ear normal.  Nose: Nose normal.  Mouth/Throat: Oropharynx is clear and moist.  Eyes: Conjunctivae are normal. Right eye exhibits no discharge. Left eye exhibits no discharge.  Neck: Normal range of motion.  Cardiovascular: Regular rhythm and normal heart sounds.   Tachycardia present.   No murmur heard. Pulmonary/Chest: Effort normal and breath sounds normal. No respiratory distress. She has no wheezes. She has no rales.  Abdominal: Soft. She exhibits no distension. There is no tenderness. There is CVA tenderness. There is no rebound and no guarding.  Right-sided CVA tenderness.   Musculoskeletal: Normal range of motion. She exhibits no edema or tenderness.  Neurological: She is alert. No cranial nerve deficit. Coordination normal.  Skin: Skin is warm and dry. No rash noted. No erythema.  Psychiatric: She has a normal mood and affect. Her behavior is normal.  Nursing note and vitals reviewed.  ED Treatments / Results  DIAGNOSTIC STUDIES: Oxygen Saturation is 99% on RA, normal by my interpretation.   COORDINATION OF CARE: 5:34 PM-Discussed next steps with pt. Pt verbalized understanding and is agreeable with the plan.   Labs (all labs ordered are listed, but only abnormal results are displayed) Labs Reviewed  URINE CULTURE - Abnormal; Notable for the following:       Result Value   Culture >=100,000 COLONIES/mL ESCHERICHIA COLI (*)    Organism ID, Bacteria ESCHERICHIA COLI (*)    All other components within normal limits  COMPREHENSIVE METABOLIC PANEL - Abnormal; Notable for the following:    Potassium 3.4 (*)    Glucose, Bld 140 (*)    BUN 5 (*)    Creatinine, Ser 1.03 (*)    Calcium 8.7 (*)    Albumin 3.2 (*)    All other components within normal limits  URINALYSIS, ROUTINE W REFLEX MICROSCOPIC - Abnormal; Notable for the following:    APPearance CLOUDY (*)    Hgb urine dipstick MODERATE (*)    Ketones, ur 80 (*)    Protein, ur 100 (*)    Nitrite POSITIVE (*)    Leukocytes, UA LARGE (*)    Bacteria, UA FEW (*)    Squamous Epithelial / LPF 6-30 (*)    All other components within normal limits  CBC WITH DIFFERENTIAL/PLATELET - Abnormal; Notable for the following:    WBC 19.2 (*)    Neutro Abs 16.9 (*)    Monocytes Absolute 1.6  (*)    All other components within normal limits  I-STAT BETA HCG BLOOD, ED ([facility], [facility], [facility] ONLY)  I-STAT CG4 LACTIC ACID, ED   EKG  EKG Interpretation None      Radiology No results found.  Procedures Procedures   Medications Ordered in ED Medications  acetaminophen (TYLENOL) tablet 650 mg (650 mg Oral Given 12/29/15 1617)  sodium chloride 0.9 % bolus 1,000 mL (0 mLs Intravenous Stopped 12/29/15 2017)  cefTRIAXone (ROCEPHIN) 1 g in dextrose 5 % 50 mL IVPB (0 g Intravenous Stopped 12/29/15 1844)  ketorolac (TORADOL) 15 MG/ML injection 15 mg (15 mg Intravenous Given 12/29/15 1808)  morphine 4 MG/ML injection 4 mg (4 mg Intravenous Given 12/29/15 1811)   Initial Impression / Assessment and Plan / ED Course  I have reviewed the triage vital signs and the nursing notes.  Pertinent labs & imaging results that were available during my care of  the patient were reviewed by me and considered in my medical decision making (see chart for details).  Clinical Course    20 year old female with flank pain and fever. Likely pyelonephritis given her symptoms and urinalysis. She is tolerating fluids in the emergency room. She received a dose of antibiotics. I feel like she is appropriate for further treatment as an outpatient. She remains mildly tachycardic although slightly improved from arrival and she generally does not appear toxic.  Final Clinical Impressions(s) / ED Diagnoses   Final diagnoses:  Pyelonephritis   New Prescriptions New Prescriptions   No medications on file   I personally preformed the services scribed in my presence. The recorded information has been reviewed is accurate. Raeford RazorStephen Breea Loncar, MD.     Raeford RazorStephen Brionne Mertz, MD 01/04/16 [phone]

## 2015-12-29 NOTE — ED Notes (Signed)
MD aware of HR 114.

## 2015-12-29 NOTE — ED Triage Notes (Signed)
Pt presents with c/o fever. The fever began 3 days ago. She reports malaise, body aches, chills, nausea, vomiting. She took theraflu, motrin, dayquil and nyquil with no improvement of symptoms.

## 2016-01-01 LAB — URINE CULTURE: Culture: >=100000 — AB

## 2016-01-02 ENCOUNTER — Telehealth: Payer: Self-pay

## 2016-01-02 NOTE — Telephone Encounter (Signed)
Post ED Visit - Positive Culture Follow-up  Culture report reviewed by antimicrobial stewardship pharmacist:  []  Enzo BiNathan Batchelder, Pharm.D. []  Celedonio MiyamotoJeremy Frens, Pharm.D., BCPS []  Garvin FilaMike Maccia, Pharm.D. []  Georgina PillionElizabeth Martin, Pharm.D., BCPS []  DelwayMinh Pham, [address]Ph[address]D., BCPS, AAHIVP []  Estella HuskMichelle Turner, Pharm.D., BCPS, AAHIVP []  Tennis Mustassie Stewart, [address]Pharm[address]D. []  Sherle Poeob Vincent, [place]D. Revonda StandardAllison Masters Pharm D Positive urine culture Treated with Cephalexin, organism sensitive to the same and no further patient follow-up is required at this time.  Jerry CarasCullom, Deeanne Deininger Burnett 01/02/2016, 1:57 PM

## 2016-07-17 ENCOUNTER — Emergency Department (HOSPITAL_COMMUNITY)
Admission: EM | Admit: 2016-07-17 | Discharge: 2016-07-17 | Disposition: A | Discharge status: Home / Self Care | Payer: No Typology Code available for payment source | Attending: Emergency Medicine | Admitting: Emergency Medicine

## 2016-07-17 ENCOUNTER — Encounter (HOSPITAL_COMMUNITY): Payer: Self-pay | Admitting: Emergency Medicine

## 2016-07-17 DIAGNOSIS — Z793 Long term (current) use of hormonal contraceptives: Secondary | ICD-10-CM | POA: Insufficient documentation

## 2016-07-17 DIAGNOSIS — A084 Viral intestinal infection, unspecified: Secondary | ICD-10-CM | POA: Insufficient documentation

## 2016-07-17 LAB — CBC
HEMATOCRIT: 40.9 % (ref 36.0–46.0)
Hemoglobin: 13.9 g/dL (ref 12.0–15.0)
MCH: 30 pg (ref 26.0–34.0)
MCHC: 34 g/dL (ref 30.0–36.0)
MCV: 88.1 fL (ref 78.0–100.0)
PLATELETS: 292 10*3/uL (ref 150–400)
RBC: 4.64 MIL/uL (ref 3.87–5.11)
RDW: 13.2 % (ref 11.5–15.5)
WBC: 7.8 10*3/uL (ref 4.0–10.5)

## 2016-07-17 LAB — COMPREHENSIVE METABOLIC PANEL
ALBUMIN: 4.1 g/dL (ref 3.5–5.0)
ALT: 14 U/L (ref 14–54)
AST: 15 U/L (ref 15–41)
Alkaline Phosphatase: 68 U/L (ref 38–126)
Anion gap: 8 (ref 5–15)
BUN: 9 mg/dL (ref 6–20)
CHLORIDE: 104 mmol/L (ref 101–111)
CO2: 26 mmol/L (ref 22–32)
CREATININE: 0.74 mg/dL (ref 0.44–1.00)
Calcium: 8.9 mg/dL (ref 8.9–10.3)
GFR calc Af Amer: >60 mL/min (ref >60)
Glucose, Bld: 93 mg/dL (ref 65–99)
Potassium: 3.7 mmol/L (ref 3.5–5.1)
SODIUM: 138 mmol/L (ref 135–145)
Total Bilirubin: 0.8 mg/dL (ref 0.3–1.2)
Total Protein: 8 g/dL (ref 6.5–8.1)

## 2016-07-17 LAB — I-STAT BETA HCG BLOOD, ED (MC, WL, AP ONLY)

## 2016-07-17 LAB — URINALYSIS, ROUTINE W REFLEX MICROSCOPIC
BILIRUBIN URINE: NEGATIVE
Glucose, UA: NEGATIVE mg/dL
KETONES UR: NEGATIVE mg/dL
LEUKOCYTES UA: NEGATIVE
Nitrite: NEGATIVE
PH: 6 (ref 5.0–8.0)
Protein, ur: NEGATIVE mg/dL
SPECIFIC GRAVITY, URINE: 1.029 (ref 1.005–1.030)

## 2016-07-17 LAB — LIPASE, BLOOD: LIPASE: 21 U/L (ref 11–51)

## 2016-07-17 MED ORDER — DICYCLOMINE HCL 20 MG PO TABS: 20.0000 mg | ORAL_TABLET | Freq: Two times a day (BID) | ORAL | 0 refills | Status: DC

## 2016-07-17 MED ORDER — ONDANSETRON 4 MG PO TBDP
4.0000 mg | ORAL_TABLET | Freq: Once | ORAL | Status: AC | PRN
Start: 2016-07-17 — End: 2016-07-17
  Administered 2016-07-17: 4 mg via ORAL
  Filled 2016-07-17: qty 1

## 2016-07-17 MED ORDER — LOPERAMIDE HCL 2 MG PO CAPS: 2.0000 mg | ORAL_CAPSULE | Freq: Four times a day (QID) | ORAL | 0 refills | Status: DC | PRN

## 2016-07-17 MED ORDER — ONDANSETRON 4 MG PO TBDP: 4.0000 mg | ORAL_TABLET | Freq: Three times a day (TID) | ORAL | 0 refills | Status: DC | PRN

## 2016-07-17 MED ORDER — SODIUM CHLORIDE 0.9 % IV BOLUS (SEPSIS)
1000.0000 mL | Freq: Once | INTRAVENOUS | Status: AC
  Administered 2016-07-17: 1000 mL via INTRAVENOUS

## 2016-07-17 MED ORDER — ONDANSETRON HCL 4 MG/2ML IJ SOLN
4.0000 mg | Freq: Once | INTRAMUSCULAR | Status: AC
  Administered 2016-07-17: 4 mg via INTRAVENOUS
  Filled 2016-07-17: qty 2

## 2016-07-17 NOTE — ED Provider Notes (Signed)
[facility]-EMERGENCY DEPT Provider Note   CSN: [id number] Arrival date & time: 07/17/16  1000     History   Chief Complaint Chief Complaint  Patient presents with  . Abdominal Pain  . Emesis  . Diarrhea    HPI Kristen Gomez is a 21 y.o. female.  HPI   Patient is a 21 year old female with no pertinent past medical history presents the ED with complaint of nausea, vomiting and diarrhea. Patient reports yesterday evening around 6 PM she began having diffuse abdominal discomfort/cramping with associated NBNB vomiting and nonbloody diarrhea. She reports eating left over [place] food earlier in the day. She reports having decreased oral intake. She denies taking any medications at home for symptoms. Denies fever, chills, body aches, chest pain, shortness of breath, cough, hematemesis, urinary symptoms, blood in urine or stool, flank pain, vaginal discharge. Patient notes she is currently on her menstrual cycle. Endorses abdominal surgical history of C-section. Patient denies any sick contacts.  Past Medical History:  Diagnosis Date  . Anxiety   . Headache   . S/P cesarean section 09/30/2014  . UTI (urinary tract infection)     Patient Active Problem List   Diagnosis Date Noted  . S/P cesarean section 09/30/2014  . Labor and delivery, indication for care 09/29/2014    Past Surgical History:  Procedure Laterality Date  . CESAREAN SECTION N/A 09/30/2014   Procedure: CESAREAN SECTION;  Surgeon: Sherian Rein, MD;  Location: [facility];  Service: Obstetrics;  Laterality: N/A;  . TONSILLECTOMY      OB History    Gravida Para Term Preterm AB Living   1 1 1     1    SAB TAB Ectopic Multiple Live Births         0 1       Home Medications    Prior to Admission medications   Medication Sig Start Date End Date Taking? Authorizing Provider  norgestimate-ethinyl estradiol (ORTHO-CYCLEN,SPRINTEC,PREVIFEM) 0.25-35 MG-MCG tablet Take 1 tablet by mouth daily.   Yes [provider]  dicyclomine (BENTYL) 20 MG tablet Take 1 tablet (20 mg total) by mouth 2 (two) times daily. 07/17/16   Barrett Henle, PA-C  loperamide (IMODIUM) 2 MG capsule Take 1 capsule (2 mg total) by mouth 4 (four) times daily as needed for diarrhea or loose stools. 07/17/16   Barrett Henle, PA-C  ondansetron (ZOFRAN ODT) 4 MG disintegrating tablet Take 1 tablet (4 mg total) by mouth every 8 (eight) hours as needed for nausea or vomiting. 07/17/16   Barrett Henle, PA-C    Family History No family history on file.  Social History Social History  Substance Use Topics  . Smoking status: Never Smoker  . Smokeless tobacco: Never Used  . Alcohol use No     Allergies   Patient has no known allergies.   Review of Systems Review of Systems  Gastrointestinal: Positive for abdominal pain, diarrhea, nausea and vomiting.  All other systems reviewed and are negative.    Physical Exam Updated Vital Signs BP 112/73 (BP Location: Right Arm)   Pulse 88   Temp 98.2 F (36.8 C) (Oral)   Resp 18   LMP 07/16/2016   SpO2 100%   Physical Exam  Constitutional: She is oriented to person, place, and time. She appears well-developed and well-nourished. No distress.  HENT:  Head: Normocephalic and atraumatic.  Mouth/Throat: Uvula is midline, oropharynx is clear and moist and mucous membranes are normal. No oropharyngeal exudate, posterior oropharyngeal edema,  posterior oropharyngeal erythema or tonsillar abscesses. No tonsillar exudate.  Eyes: Conjunctivae and EOM are normal. Right eye exhibits no discharge. Left eye exhibits no discharge. No scleral icterus.  Neck: Normal range of motion. Neck supple.  Cardiovascular: Normal rate, regular rhythm, normal heart sounds and intact distal pulses.   Pulmonary/Chest: Effort normal and breath sounds normal. No respiratory distress. She has no wheezes. She has no rales. She exhibits no tenderness.  Abdominal: Soft. Bowel sounds are  normal. She exhibits no distension and no mass. There is no tenderness. There is no rebound and no guarding. No hernia.  No CVA tenderness  Musculoskeletal: Normal range of motion. She exhibits no edema.  Neurological: She is alert and oriented to person, place, and time.  Skin: Skin is warm and dry. She is not diaphoretic.  Nursing note and vitals reviewed.    ED Treatments / Results  Labs (all labs ordered are listed, but only abnormal results are displayed) Labs Reviewed  URINALYSIS, ROUTINE W REFLEX MICROSCOPIC - Abnormal; Notable for the following:       Result Value   Hgb urine dipstick LARGE (*)    Bacteria, UA FEW (*)    Squamous Epithelial / LPF 0-5 (*)    All other components within normal limits  LIPASE, BLOOD  COMPREHENSIVE METABOLIC PANEL  CBC  I-STAT BETA HCG BLOOD, ED ([facility], [facility], [facility] ONLY)    EKG  EKG Interpretation None       Radiology No results found.  Procedures Procedures (including critical care time)  Medications Ordered in ED Medications  ondansetron (ZOFRAN-ODT) disintegrating tablet 4 mg (4 mg Oral Given 07/17/16 1019)  sodium chloride 0.9 % bolus 1,000 mL (1,000 mLs Intravenous New Bag/Given 07/17/16 1340)  ondansetron (ZOFRAN) injection 4 mg (4 mg Intravenous Given 07/17/16 1342)     Initial Impression / Assessment and Plan / ED Course  I have reviewed the triage vital signs and the nursing notes.  Pertinent labs & imaging results that were available during my care of the patient were reviewed by me and considered in my medical decision making (see chart for details).     Patient with symptoms consistent with food poisoning/viral gastroenteritis.  Vitals are stable, no fever.  No signs of dehydration, tolerating PO fluids > 6 oz.  Lungs are clear.  No focal abdominal pain, no concern for appendicitis, cholecystitis, pancreatitis, ruptured viscus, UTI, kidney stone, or any other abdominal etiology.  Negative pregnancy. Supportive therapy indicated  with return if symptoms worsen.  Patient counseled.  Final Clinical Impressions(s) / ED Diagnoses   Final diagnoses:  Viral gastroenteritis    New Prescriptions New Prescriptions   DICYCLOMINE (BENTYL) 20 MG TABLET    Take 1 tablet (20 mg total) by mouth 2 (two) times daily.   LOPERAMIDE (IMODIUM) 2 MG CAPSULE    Take 1 capsule (2 mg total) by mouth 4 (four) times daily as needed for diarrhea or loose stools.   ONDANSETRON (ZOFRAN ODT) 4 MG DISINTEGRATING TABLET    Take 1 tablet (4 mg total) by mouth every 8 (eight) hours as needed for nausea or vomiting.     Barrett Henleadeau, Caylen Yardley Elizabeth, PA-C 07/17/16 1504    Derwood KaplanNanavati, Ankit, MD 07/18/16 1921

## 2016-07-17 NOTE — ED Notes (Signed)
Pt had drawn in lab: Gold  Lavender Lt green 2-dark green  Pt's urine sample sent to lab in triage.

## 2016-07-17 NOTE — ED Triage Notes (Signed)
Patient c/o generalized abd pain that started yesterday with n/v/d.

## 2016-07-17 NOTE — Discharge Instructions (Signed)
Take your medication as prescribed as needed for nausea, vomiting and diarrhea. Continue drinking fluids at home to remain hydrated. I recommend eating a bland diet for the next few days and see her symptoms have improved. Follow-up with the primary care provider's office listed below within the next 3-4 days if your symptoms have not improved. Return to the emergency department if symptoms worsen or new onset of fever, chest pain, difficulty breathing, new/worsening abdominal pain, vomiting blood, unable to keep fluids down.

## 2016-12-31 ENCOUNTER — Encounter (HOSPITAL_COMMUNITY): Payer: Self-pay | Admitting: Emergency Medicine

## 2016-12-31 ENCOUNTER — Emergency Department (HOSPITAL_COMMUNITY)
Admission: EM | Admit: 2016-12-31 | Discharge: 2016-12-31 | Disposition: A | Discharge status: Home / Self Care | Payer: 59 | Attending: Emergency Medicine | Admitting: Emergency Medicine

## 2016-12-31 ENCOUNTER — Emergency Department (HOSPITAL_COMMUNITY): Payer: 59

## 2016-12-31 DIAGNOSIS — R1010 Upper abdominal pain, unspecified: Secondary | ICD-10-CM | POA: Diagnosis not present

## 2016-12-31 DIAGNOSIS — R1011 Right upper quadrant pain: Secondary | ICD-10-CM | POA: Diagnosis present

## 2016-12-31 LAB — COMPREHENSIVE METABOLIC PANEL
ALBUMIN: 3.6 g/dL (ref 3.5–5.0)
ALK PHOS: 67 U/L (ref 38–126)
ALT: 12 U/L — ABNORMAL LOW (ref 14–54)
ANION GAP: 4 — AB (ref 5–15)
AST: 16 U/L (ref 15–41)
BILIRUBIN TOTAL: 0.5 mg/dL (ref 0.3–1.2)
BUN: 11 mg/dL (ref 6–20)
CALCIUM: 8.5 mg/dL — AB (ref 8.9–10.3)
CO2: 25 mmol/L (ref 22–32)
Chloride: 109 mmol/L (ref 101–111)
Creatinine, Ser: 0.73 mg/dL (ref 0.44–1.00)
Glucose, Bld: 94 mg/dL (ref 65–99)
POTASSIUM: 3.6 mmol/L (ref 3.5–5.1)
Sodium: 138 mmol/L (ref 135–145)
TOTAL PROTEIN: 6.9 g/dL (ref 6.5–8.1)

## 2016-12-31 LAB — URINALYSIS, ROUTINE W REFLEX MICROSCOPIC
Bilirubin Urine: NEGATIVE
GLUCOSE, UA: NEGATIVE mg/dL
KETONES UR: NEGATIVE mg/dL
LEUKOCYTES UA: NEGATIVE
Nitrite: NEGATIVE
PH: 6 (ref 5.0–8.0)
PROTEIN: NEGATIVE mg/dL
Specific Gravity, Urine: 1.005 (ref 1.005–1.030)

## 2016-12-31 LAB — CBC WITH DIFFERENTIAL/PLATELET
BASOS PCT: 1 %
Basophils Absolute: 0 10*3/uL (ref 0.0–0.1)
Eosinophils Absolute: 0.3 10*3/uL (ref 0.0–0.7)
Eosinophils Relative: 4 %
HEMATOCRIT: 33.4 % — AB (ref 36.0–46.0)
HEMOGLOBIN: 11.2 g/dL — AB (ref 12.0–15.0)
LYMPHS ABS: 2.7 10*3/uL (ref 0.7–4.0)
Lymphocytes Relative: 34 %
MCH: 30 pg (ref 26.0–34.0)
MCHC: 33.5 g/dL (ref 30.0–36.0)
MCV: 89.5 fL (ref 78.0–100.0)
MONO ABS: 0.6 10*3/uL (ref 0.1–1.0)
MONOS PCT: 7 %
NEUTROS ABS: 4.4 10*3/uL (ref 1.7–7.7)
NEUTROS PCT: 54 %
Platelets: 253 10*3/uL (ref 150–400)
RBC: 3.73 MIL/uL — ABNORMAL LOW (ref 3.87–5.11)
RDW: 13.3 % (ref 11.5–15.5)
WBC: 8 10*3/uL (ref 4.0–10.5)

## 2016-12-31 LAB — POC URINE PREG, ED: Preg Test, Ur: NEGATIVE

## 2016-12-31 LAB — LIPASE, BLOOD: LIPASE: 29 U/L (ref 11–51)

## 2016-12-31 MED ORDER — TRAMADOL HCL 50 MG PO TABS: 50.0000 mg | ORAL_TABLET | Freq: Four times a day (QID) | ORAL | 0 refills | Status: DC | PRN

## 2016-12-31 MED ORDER — ONDANSETRON 4 MG PO TBDP: ORAL_TABLET | ORAL | 0 refills | Status: DC

## 2016-12-31 MED ORDER — ONDANSETRON HCL 4 MG/2ML IJ SOLN
4.0000 mg | Freq: Once | INTRAMUSCULAR | Status: AC
  Administered 2016-12-31: 4 mg via INTRAVENOUS
  Filled 2016-12-31: qty 2

## 2016-12-31 MED ORDER — SODIUM CHLORIDE 0.9 % IV BOLUS (SEPSIS)
500.0000 mL | Freq: Once | INTRAVENOUS | Status: AC
  Administered 2016-12-31: 500 mL via INTRAVENOUS

## 2016-12-31 MED ORDER — HYDROMORPHONE HCL 1 MG/ML IJ SOLN
0.5000 mg | Freq: Once | INTRAMUSCULAR | Status: AC
  Administered 2016-12-31: 0.5 mg via INTRAVENOUS
  Filled 2016-12-31: qty 1

## 2016-12-31 MED ORDER — PANTOPRAZOLE SODIUM 20 MG PO TBEC: 20.0000 mg | DELAYED_RELEASE_TABLET | Freq: Every day | ORAL | 0 refills | Status: DC

## 2016-12-31 NOTE — ED Provider Notes (Signed)
[facility]-EMERGENCY DEPT Provider Note   CSN: [id number] Arrival date & time: 12/31/16  1328     History   Chief Complaint Chief Complaint  Patient presents with  . Flank Pain    HPI Kristen Gomez is a 21 y.o. female.  Patient complains of abdominal pain.  No fever no chills no vomiting   The history is provided by the patient. No language interpreter was used.  Abdominal Pain   This is a new problem. The current episode started more than 2 days ago. The problem occurs constantly. The problem has not changed since onset.The pain is associated with an unknown factor. The pain is located in the RUQ. The quality of the pain is aching. The pain is at a severity of 6/10. The pain is moderate. Pertinent negatives include anorexia, diarrhea, frequency, hematuria and headaches.    Past Medical History:  Diagnosis Date  . Anxiety   . Headache   . S/P cesarean section 09/30/2014  . UTI (urinary tract infection)     Patient Active Problem List   Diagnosis Date Noted  . S/P cesarean section 09/30/2014  . Labor and delivery, indication for care 09/29/2014    Past Surgical History:  Procedure Laterality Date  . CESAREAN SECTION N/A 09/30/2014   Procedure: CESAREAN SECTION;  Surgeon: Sherian ReinJody Bovard-Stuckert, MD;  Location: [facility];  Service: Obstetrics;  Laterality: N/A;  . TONSILLECTOMY      OB History    Gravida Para Term Preterm AB Living   1 1 1     1    SAB TAB Ectopic Multiple Live Births         0 1       Home Medications    Prior to Admission medications   Medication Sig Start Date End Date Taking? Authorizing Provider  acetaminophen (TYLENOL) 500 MG tablet Take 1,000 mg by mouth every 6 (six) hours as needed for headache.   Yes [provider]  norgestimate-ethinyl estradiol (ORTHO-CYCLEN,SPRINTEC,PREVIFEM) 0.25-35 MG-MCG tablet Take 1 tablet by mouth daily.   Yes [provider]  dicyclomine (BENTYL) 20 MG tablet Take 1  tablet (20 mg total) by mouth 2 (two) times daily. Patient not taking: Reported on 12/31/2016 07/17/16   Barrett HenleNadeau, Nicole Elizabeth, PA-C  loperamide (IMODIUM) 2 MG capsule Take 1 capsule (2 mg total) by mouth 4 (four) times daily as needed for diarrhea or loose stools. Patient not taking: Reported on 12/31/2016 07/17/16   Barrett HenleNadeau, Nicole Elizabeth, PA-C  ondansetron (ZOFRAN ODT) 4 MG disintegrating tablet Take 1 tablet (4 mg total) by mouth every 8 (eight) hours as needed for nausea or vomiting. Patient not taking: Reported on 12/31/2016 07/17/16   Barrett HenleNadeau, Nicole Elizabeth, PA-C  ondansetron (ZOFRAN ODT) 4 MG disintegrating tablet 4mg  ODT q4 hours prn nausea/vomit 12/31/16   Bethann BerkshireZammit, Tomeshia Pizzi, MD  pantoprazole (PROTONIX) 20 MG tablet Take 1 tablet (20 mg total) by mouth daily. 12/31/16   Bethann BerkshireZammit, Lakiesha Ralphs, MD  traMADol (ULTRAM) 50 MG tablet Take 1 tablet (50 mg total) by mouth every 6 (six) hours as needed. 12/31/16   Bethann BerkshireZammit, Zymarion Favorite, MD    Family History No family history on file.  Social History Social History   Tobacco Use  . Smoking status: Never Smoker  . Smokeless tobacco: Never Used  Substance Use Topics  . Alcohol use: No  . Drug use: No     Allergies   Patient has no known allergies.   Review of Systems Review of Systems  Constitutional:  Negative for appetite change and fatigue.  HENT: Negative for congestion, ear discharge and sinus pressure.   Eyes: Negative for discharge.  Respiratory: Negative for cough.   Cardiovascular: Negative for chest pain.  Gastrointestinal: Positive for abdominal pain. Negative for anorexia and diarrhea.  Genitourinary: Negative for frequency and hematuria.  Musculoskeletal: Negative for back pain.  Skin: Negative for rash.  Neurological: Negative for seizures and headaches.  Psychiatric/Behavioral: Negative for hallucinations.     Physical Exam Updated Vital Signs BP 116/69 (BP Location: Left Arm)   Pulse 89   Temp 98.3 F (36.8 C) (Oral)    Resp 18   LMP 12/27/2016   SpO2 99%   Physical Exam  Constitutional: She is oriented to person, place, and time. She appears well-developed.  HENT:  Head: Normocephalic.  Eyes: Conjunctivae and EOM are normal. No scleral icterus.  Neck: Neck supple. No thyromegaly present.  Cardiovascular: Normal rate and regular rhythm. Exam reveals no gallop and no friction rub.  No murmur heard. Pulmonary/Chest: No stridor. She has no wheezes. She has no rales. She exhibits no tenderness.  Abdominal: She exhibits no distension. There is tenderness. There is no rebound.  Moderate right upper quadrant tenderness  Musculoskeletal: Normal range of motion. She exhibits no edema.  Lymphadenopathy:    She has no cervical adenopathy.  Neurological: She is oriented to person, place, and time. She exhibits normal muscle tone. Coordination normal.  Skin: No rash noted. No erythema.  Psychiatric: She has a normal mood and affect. Her behavior is normal.     ED Treatments / Results  Labs (all labs ordered are listed, but only abnormal results are displayed) Labs Reviewed  URINALYSIS, ROUTINE W REFLEX MICROSCOPIC - Abnormal; Notable for the following components:      Result Value   Hgb urine dipstick MODERATE (*)    Bacteria, UA FEW (*)    Squamous Epithelial / LPF 0-5 (*)    All other components within normal limits  CBC WITH DIFFERENTIAL/PLATELET - Abnormal; Notable for the following components:   RBC 3.73 (*)    Hemoglobin 11.2 (*)    HCT 33.4 (*)    All other components within normal limits  COMPREHENSIVE METABOLIC PANEL - Abnormal; Notable for the following components:   Calcium 8.5 (*)    ALT 12 (*)    Anion gap 4 (*)    All other components within normal limits  LIPASE, BLOOD  POC URINE PREG, ED    EKG  EKG Interpretation None       Radiology [place] Abdomen Complete  Result Date: 12/31/2016 CLINICAL DATA:  Right flank pain for 1 week EXAM: ABDOMEN ULTRASOUND COMPLETE COMPARISON:   None. FINDINGS: Gallbladder: No gallstones or wall thickening visualized. No sonographic Murphy sign noted by sonographer. Common bile duct: Diameter: Normal caliber, 2 mm Liver: No focal lesion identified. Within normal limits in parenchymal echogenicity. Portal vein is patent on color Doppler imaging with normal direction of blood flow towards the liver. IVC: No abnormality visualized. Pancreas: Visualized portion unremarkable. Spleen: Size and appearance within normal limits. Right Kidney: Length: 10.5 cm. 5 mm shadowing echogenic focus in the midpole, likely nonobstructing stone. Echogenicity within normal limits. No mass or hydronephrosis visualized. Left Kidney: Length: 10.2 cm. 4 mm echogenic shadowing focus in the mid to lower pole, likely nonobstructing stone. Echogenicity within normal limits. No mass or hydronephrosis visualized. Abdominal aorta: No aneurysm visualized. Other findings: None. IMPRESSION: No acute findings. Suspect bilateral nephrolithiasis.  No hydronephrosis. Electronically Signed  By: Charlett Nose M.D.   On: 12/31/2016 16:39    Procedures Procedures (including critical care time)  Medications Ordered in ED Medications  HYDROmorphone (DILAUDID) injection 0.5 mg (0.5 mg Intravenous Given 12/31/16 1517)  ondansetron (ZOFRAN) injection 4 mg (4 mg Intravenous Given 12/31/16 1517)  sodium chloride 0.9 % bolus 500 mL (0 mLs Intravenous Stopped 12/31/16 1629)     Initial Impression / Assessment and Plan / ED Course  I have reviewed the triage vital signs and the nursing notes.  Pertinent labs & imaging results that were available during my care of the patient were reviewed by me and considered in my medical decision making (see chart for details).   Labs including urinalysis unremarkable.  Ultrasound abdomen unremarkable.  Patient will be treated with Protonix Ultram and Zofran and is referred to gastroenterologist for her abdominal pain.  She can also follow-up with her  primary care doctor if she finds one    Final Clinical Impressions(s) / ED Diagnoses   Final diagnoses:  Pain of upper abdomen    ED Discharge Orders        Ordered    pantoprazole (PROTONIX) 20 MG tablet  Daily     12/31/16 1715    traMADol (ULTRAM) 50 MG tablet  Every 6 hours PRN     12/31/16 1715    ondansetron (ZOFRAN ODT) 4 MG disintegrating tablet     12/31/16 1715       Bethann Berkshire, MD 12/31/16 1719

## 2016-12-31 NOTE — Discharge Instructions (Signed)
Follow-up with a family doctor or you can follow-up with [facility] for recheck of abdominal discomfort

## 2016-12-31 NOTE — ED Triage Notes (Signed)
Patient c/o right flank pain for about week that is worse with movement. Denies any urinary problems. N/v/d.

## 2017-03-15 ENCOUNTER — Encounter (HOSPITAL_COMMUNITY): Payer: Self-pay | Admitting: Emergency Medicine

## 2017-03-15 ENCOUNTER — Emergency Department (HOSPITAL_COMMUNITY)
Admission: EM | Admit: 2017-03-15 | Discharge: 2017-03-15 | Disposition: A | Discharge status: Home / Self Care | Payer: 59 | Attending: Emergency Medicine | Admitting: Emergency Medicine

## 2017-03-15 DIAGNOSIS — R6889 Other general symptoms and signs: Secondary | ICD-10-CM | POA: Insufficient documentation

## 2017-03-15 DIAGNOSIS — R070 Pain in throat: Secondary | ICD-10-CM | POA: Insufficient documentation

## 2017-03-15 DIAGNOSIS — R509 Fever, unspecified: Secondary | ICD-10-CM | POA: Diagnosis not present

## 2017-03-15 DIAGNOSIS — Z79899 Other long term (current) drug therapy: Secondary | ICD-10-CM | POA: Diagnosis not present

## 2017-03-15 DIAGNOSIS — R0981 Nasal congestion: Secondary | ICD-10-CM | POA: Diagnosis not present

## 2017-03-15 DIAGNOSIS — R05 Cough: Secondary | ICD-10-CM | POA: Diagnosis present

## 2017-03-15 MED ORDER — IBUPROFEN 800 MG PO TABS
800.0000 mg | ORAL_TABLET | Freq: Once | ORAL | Status: AC
  Administered 2017-03-15: 800 mg via ORAL
  Filled 2017-03-15: qty 1

## 2017-03-15 MED ORDER — FLUTICASONE PROPIONATE 50 MCG/ACT NA SUSP: 2.0000 | Freq: Every day | NASAL | 0 refills | Status: DC

## 2017-03-15 MED ORDER — ONDANSETRON 4 MG PO TBDP
4.0000 mg | ORAL_TABLET | Freq: Once | ORAL | Status: AC
Start: 2017-03-15 — End: 2017-03-15
  Administered 2017-03-15: 4 mg via ORAL
  Filled 2017-03-15: qty 1

## 2017-03-15 MED ORDER — ONDANSETRON HCL 4 MG PO TABS: 4.0000 mg | ORAL_TABLET | Freq: Four times a day (QID) | ORAL | 0 refills | Status: DC

## 2017-03-15 MED ORDER — PROMETHAZINE-DM 6.25-15 MG/5ML PO SYRP: 5.0000 mL | ORAL_SOLUTION | Freq: Four times a day (QID) | ORAL | 0 refills | Status: DC | PRN

## 2017-03-15 NOTE — ED Triage Notes (Addendum)
Patient c/o cough, congestion, headache, body aches, and nausea since yesterday. Reports being around others with similar sx. Denies V/D. States last dose of tylenol approximately 1 hour ago.

## 2017-03-15 NOTE — Discharge Instructions (Signed)
You likely have a viral illness.  This should be treated symptomatically. Use Tylenol or ibuprofen as needed for fevers or body aches. Use Flonase daily for nasal congestion and cough. Take zofran as needed for nausea or vomiting.  Use cough syrup as needed.  Make sure you stay well-hydrated with water. Wash your hands frequently to prevent spread of infection. Return to the emergency room if you develop chest pain, difficulty breathing, or any new or worsening symptoms.

## 2017-03-15 NOTE — ED Notes (Signed)
Py given 2 cans of regular ginger ale, per her request.

## 2017-03-15 NOTE — ED Provider Notes (Signed)
[facility]-EMERGENCY DEPT Provider Note   CSN: [id number] Arrival date & time: 03/15/17  1118     History   Chief Complaint Chief Complaint  Patient presents with  . Cough  . Nasal Congestion    HPI Kristen Gomez is a 22 y.o. female presenting for evaluation of fever, cough, nasal congestion, body aches, and nausea.  Patient states that her symptoms began all of the sudden yesterday.  It started with a sore throat, and then she developed fever, chills, and body aches.  She has nasal congestion and a nonproductive cough.  She has associated frontal headache/sinus pressure.  She reports nausea without vomiting.  She reports mild bilateral ear pain.  She denies eye pain, chest pain, shortness of breath, abdominal pain, urinary symptoms, abnormal bowel movements.  She denies sick contacts.  She states she has no other medical problems, does not take medications daily.  She took Tylenol 1 hour prior to arrival, which improved her fever but did not help her pain.  HPI  Past Medical History:  Diagnosis Date  . Anxiety   . Headache   . S/P cesarean section 09/30/2014  . UTI (urinary tract infection)     Patient Active Problem List   Diagnosis Date Noted  . S/P cesarean section 09/30/2014  . Labor and delivery, indication for care 09/29/2014    Past Surgical History:  Procedure Laterality Date  . CESAREAN SECTION N/A 09/30/2014   Procedure: CESAREAN SECTION;  Surgeon: Sherian ReinJody Bovard-Stuckert, MD;  Location: [facility];  Service: Obstetrics;  Laterality: N/A;  . TONSILLECTOMY      OB History    Gravida Para Term Preterm AB Living   1 1 1     1    SAB TAB Ectopic Multiple Live Births         0 1       Home Medications    Prior to Admission medications   Medication Sig Start Date End Date Taking? Authorizing Provider  acetaminophen (TYLENOL) 500 MG tablet Take 1,000 mg by mouth every 6 (six) hours as needed for headache.    [provider]    dicyclomine (BENTYL) 20 MG tablet Take 1 tablet (20 mg total) by mouth 2 (two) times daily. Patient not taking: Reported on 12/31/2016 07/17/16   Barrett HenleNadeau, Nicole Elizabeth, PA-C  fluticasone [employer](FLONASE) 50 MCG/ACT nasal spray Place 2 sprays into both nostrils daily. 03/15/17   Ammon Muscatello, PA-C  loperamide (IMODIUM) 2 MG capsule Take 1 capsule (2 mg total) by mouth 4 (four) times daily as needed for diarrhea or loose stools. Patient not taking: Reported on 12/31/2016 07/17/16   Barrett HenleNadeau, Nicole Elizabeth, PA-C  norgestimate-ethinyl estradiol (ORTHO-CYCLEN,SPRINTEC,PREVIFEM) 0.25-35 MG-MCG tablet Take 1 tablet by mouth daily.    [provider]  ondansetron (ZOFRAN ODT) 4 MG disintegrating tablet Take 1 tablet (4 mg total) by mouth every 8 (eight) hours as needed for nausea or vomiting. Patient not taking: Reported on 12/31/2016 07/17/16   Barrett HenleNadeau, Nicole Elizabeth, PA-C  ondansetron (ZOFRAN ODT) 4 MG disintegrating tablet 4mg  ODT q4 hours prn nausea/vomit 12/31/16   Bethann BerkshireZammit, Joseph, MD  ondansetron (ZOFRAN) 4 MG tablet Take 1 tablet (4 mg total) by mouth every 6 (six) hours. 03/15/17   Jontez Redfield, PA-C  pantoprazole (PROTONIX) 20 MG tablet Take 1 tablet (20 mg total) by mouth daily. 12/31/16   Bethann BerkshireZammit, Joseph, MD  promethazine-dextromethorphan (PROMETHAZINE-DM) 6.25-15 MG/5ML syrup Take 5 mLs by mouth 4 (four) times daily as needed for cough. 03/15/17  Latwan Luchsinger, PA-C  traMADol (ULTRAM) 50 MG tablet Take 1 tablet (50 mg total) by mouth every 6 (six) hours as needed. 12/31/16   Bethann Berkshire, MD    Family History No family history on file.  Social History Social History   Tobacco Use  . Smoking status: Never Smoker  . Smokeless tobacco: Never Used  Substance Use Topics  . Alcohol use: No  . Drug use: No     Allergies   Patient has no known allergies.   Review of Systems Review of Systems  Constitutional: Positive for chills and fever.  HENT: Positive for  congestion, ear pain, sinus pressure, sinus pain and sore throat.   Respiratory: Positive for cough.   Gastrointestinal: Positive for nausea.  Musculoskeletal: Positive for myalgias.  Neurological: Positive for headaches.  All other systems reviewed and are negative.    Physical Exam Updated Vital Signs BP 100/62   Pulse (!) 115   Temp (!) 100.5 F (38.1 C) (Oral)   Resp 16   LMP 02/12/2017   SpO2 95%   Physical Exam  Constitutional: She is oriented to person, place, and time. She appears well-developed and well-nourished. No distress.  Pt appears uncomfortable, but in NAD.   HENT:  Head: Normocephalic and atraumatic.  Right Ear: Tympanic membrane, external ear and ear canal normal.  Left Ear: Tympanic membrane, external ear and ear canal normal.  Nose: Mucosal edema present. Right sinus exhibits maxillary sinus tenderness and frontal sinus tenderness. Left sinus exhibits maxillary sinus tenderness and frontal sinus tenderness.  Mouth/Throat: Uvula is midline and mucous membranes are normal. Posterior oropharyngeal erythema present. No oropharyngeal exudate, posterior oropharyngeal edema or tonsillar abscesses. No tonsillar exudate.  Nasal mucosal edema.  Mild tenderness palpation of frontal and maxillary sinuses bilaterally.  OP mildly erythematous without exudate or swelling.  Uvula midline with equal palate rise.  TMs nonerythematous and not bulging bilaterally.  Eyes: Conjunctivae and EOM are normal. Pupils are equal, round, and reactive to light.  Neck: Normal range of motion.  Cardiovascular: Regular rhythm and intact distal pulses.  Tachycardic  Pulmonary/Chest: Effort normal and breath sounds normal. She has no decreased breath sounds. She has no wheezes. She has no rhonchi. She has no rales.  Pt speaking in full sentences without difficulty. Clear lung sounds in all fields.   Abdominal: Soft. She exhibits no distension and no mass. There is no tenderness. There is no  guarding.  Musculoskeletal: Normal range of motion.  Lymphadenopathy:    She has cervical adenopathy.  Neurological: She is alert and oriented to person, place, and time.  Skin: Skin is warm.  Psychiatric: She has a normal mood and affect.  Nursing note and vitals reviewed.    ED Treatments / Results  Labs (all labs ordered are listed, but only abnormal results are displayed) Labs Reviewed - No data to display  EKG  EKG Interpretation None       Radiology No results found.  Procedures Procedures (including critical care time)  Medications Ordered in ED Medications  ondansetron (ZOFRAN-ODT) disintegrating tablet 4 mg (4 mg Oral Given 03/15/17 1220)  ibuprofen (ADVIL,MOTRIN) tablet 800 mg (800 mg Oral Given 03/15/17 1239)     Initial Impression / Assessment and Plan / ED Course  I have reviewed the triage vital signs and the nursing notes.  Pertinent labs & imaging results that were available during my care of the patient were reviewed by me and considered in my medical decision making (see chart for  details).     Patient presenting with 2 day h/o flu like symptoms.  Physical exam shows patient who appears uncomfortable but not in any distress.  Initially afebrile although still tachycardic.  Pulmonary exam reassuring.  Doubt pneumonia, strep, other bacterial infection, or peritonsillar abscess. Likely flu URI.  Will give zofran for nausea and reassess vitals.   Patient's temperature elevated again, although heart rate is mildly improved.  Will give ibuprofen, encourage fluids, and reassess.  On reassessment, temperature and heart rate trending down.  BP stable. Patient remains huddled under multiple layers in a blanket.  Likely flu.  Will treat symptomatically.  Patient to follow-up with primary care as needed.  At this time, patient appears safe for discharge.  Return precautions given.  Patient states she understands and agrees to plan.   Final Clinical Impressions(s)  / ED Diagnoses   Final diagnoses:  Flu-like symptoms    ED Discharge Orders        Ordered    ondansetron (ZOFRAN) 4 MG tablet  Every 6 hours     03/15/17 1221    fluticasone (FLONASE) 50 MCG/ACT nasal spray  Daily     03/15/17 1221    promethazine-dextromethorphan (PROMETHAZINE-DM) 6.25-15 MG/5ML syrup  4 times daily PRN     03/15/17 1221       Aspen Lawrance, PA-C 03/15/17 2150    Terrilee Files, MD 03/17/17 1130

## 2017-06-18 LAB — OB RESULTS CONSOLE RUBELLA ANTIBODY, IGM
RUBELLA: IMMUNE
Rubella: NON-IMMUNE/NOT IMMUNE

## 2017-06-18 LAB — OB RESULTS CONSOLE GC/CHLAMYDIA
CHLAMYDIA, DNA PROBE: NEGATIVE
Chlamydia: NEGATIVE
Gonorrhea: NEGATIVE
Gonorrhea: NEGATIVE

## 2017-06-18 LAB — OB RESULTS CONSOLE ANTIBODY SCREEN: Antibody Screen: NEGATIVE

## 2017-06-18 LAB — OB RESULTS CONSOLE HEPATITIS B SURFACE ANTIGEN
HEP B S AG: NEGATIVE
Hepatitis B Surface Ag: NEGATIVE

## 2017-06-18 LAB — OB RESULTS CONSOLE RPR
RPR: NONREACTIVE
RPR: NONREACTIVE

## 2017-06-18 LAB — OB RESULTS CONSOLE ABO/RH: RH TYPE: POSITIVE

## 2017-06-18 LAB — OB RESULTS CONSOLE HIV ANTIBODY (ROUTINE TESTING)
HIV: NONREACTIVE
HIV: NONREACTIVE

## 2017-12-07 ENCOUNTER — Ambulatory Visit (HOSPITAL_COMMUNITY)
Admission: EM | Admit: 2017-12-07 | Discharge: 2017-12-07 | Disposition: A | Discharge status: Home / Self Care | Payer: 59 | Attending: Family Medicine | Admitting: Family Medicine

## 2017-12-07 ENCOUNTER — Encounter (HOSPITAL_COMMUNITY): Payer: Self-pay | Admitting: Emergency Medicine

## 2017-12-07 DIAGNOSIS — R05 Cough: Secondary | ICD-10-CM

## 2017-12-07 DIAGNOSIS — R059 Cough, unspecified: Secondary | ICD-10-CM

## 2017-12-07 MED ORDER — ALBUTEROL SULFATE HFA 108 (90 BASE) MCG/ACT IN AERS
INHALATION_SPRAY | RESPIRATORY_TRACT | Status: AC
  Filled 2017-12-07: qty 6.7

## 2017-12-07 MED ORDER — IPRATROPIUM BROMIDE 0.06 % NA SOLN: 2.0000 | Freq: Four times a day (QID) | NASAL | 0 refills | Status: DC

## 2017-12-07 MED ORDER — PREDNISONE 10 MG PO TABS: 10.0000 mg | ORAL_TABLET | Freq: Every day | ORAL | 0 refills | Status: DC

## 2017-12-07 MED ORDER — FLUTICASONE PROPIONATE 50 MCG/ACT NA SUSP: 2.0000 | Freq: Every day | NASAL | 0 refills | Status: DC

## 2017-12-07 MED ORDER — ALBUTEROL SULFATE HFA 108 (90 BASE) MCG/ACT IN AERS
2.0000 | INHALATION_SPRAY | Freq: Once | RESPIRATORY_TRACT | Status: AC
  Administered 2017-12-07: 2 via RESPIRATORY_TRACT

## 2017-12-07 NOTE — ED Provider Notes (Signed)
[facility]    CSN: [id number] Arrival date & time: 12/07/17  1134     History   Chief Complaint Chief Complaint  Patient presents with  . Cough    HPI Kristen Gomez is a 22 y.o. female.   22 year old female who is 36-[redacted] weeks pregnant comes in for few day history of URI symptoms. Has had cough, mild nasal congestion/drainage. Denies fever, chills, night sweats. Now with continuing nonproductive cough that is worsening. Denies chest pain, shortness of breath, wheezing. Denies reflux symptoms. otc cough medicine without relief. Never smoker.  Still with normal fetal movement. No abdominal pain, vaginal bleeding. OB appointment later today.      Past Medical History:  Diagnosis Date  . Anxiety   . Headache   . S/P cesarean section 09/30/2014  . UTI (urinary tract infection)     Patient Active Problem List   Diagnosis Date Noted  . S/P cesarean section 09/30/2014  . Labor and delivery, indication for care 09/29/2014    Past Surgical History:  Procedure Laterality Date  . CESAREAN SECTION N/A 09/30/2014   Procedure: CESAREAN SECTION;  Surgeon: Sherian Rein, MD;  Location: [facility];  Service: Obstetrics;  Laterality: N/A;  . TONSILLECTOMY      OB History    Gravida  2   Para  1   Term  1   Preterm      AB      Living  1     SAB      TAB      Ectopic      Multiple  0   Live Births  1            Home Medications    Prior to Admission medications   Medication Sig Start Date End Date Taking? Authorizing Provider  acetaminophen (TYLENOL) 500 MG tablet Take 1,000 mg by mouth every 6 (six) hours as needed for headache.    [provider]  dicyclomine (BENTYL) 20 MG tablet Take 1 tablet (20 mg total) by mouth 2 (two) times daily. Patient not taking: Reported on 12/31/2016 07/17/16   Barrett Henle, PA-C  fluticasone [facility]) 50 MCG/ACT nasal spray Place 2 sprays into both nostrils daily. 12/07/17   Cathie Hoops, Paetyn Pietrzak V,  PA-C  ipratropium (ATROVENT) 0.06 % nasal spray Place 2 sprays into both nostrils 4 (four) times daily. 12/07/17   Cathie Hoops, Deiona Hooper V, PA-C  loperamide (IMODIUM) 2 MG capsule Take 1 capsule (2 mg total) by mouth 4 (four) times daily as needed for diarrhea or loose stools. Patient not taking: Reported on 12/31/2016 07/17/16   Barrett Henle, PA-C  norgestimate-ethinyl estradiol (ORTHO-CYCLEN,SPRINTEC,PREVIFEM) 0.25-35 MG-MCG tablet Take 1 tablet by mouth daily.    [provider]  ondansetron (ZOFRAN ODT) 4 MG disintegrating tablet Take 1 tablet (4 mg total) by mouth every 8 (eight) hours as needed for nausea or vomiting. Patient not taking: Reported on 12/31/2016 07/17/16   Barrett Henle, PA-C  ondansetron (ZOFRAN ODT) 4 MG disintegrating tablet 4mg  ODT q4 hours prn nausea/vomit Patient not taking: Reported on 12/07/2017 12/31/16   Bethann Berkshire, MD  ondansetron (ZOFRAN) 4 MG tablet Take 1 tablet (4 mg total) by mouth every 6 (six) hours. Patient not taking: Reported on 12/07/2017 03/15/17   Caccavale, Sophia, PA-C  pantoprazole (PROTONIX) 20 MG tablet Take 1 tablet (20 mg total) by mouth daily. 12/31/16   Bethann Berkshire, MD  predniSONE (DELTASONE) 10 MG tablet Take 1 tablet (10  mg total) by mouth daily with breakfast. 12/07/17   Cathie Hoops, Jermery Caratachea V, PA-C  promethazine-dextromethorphan (PROMETHAZINE-DM) 6.25-15 MG/5ML syrup Take 5 mLs by mouth 4 (four) times daily as needed for cough. Patient not taking: Reported on 12/07/2017 03/15/17   Caccavale, Sophia, PA-C  traMADol (ULTRAM) 50 MG tablet Take 1 tablet (50 mg total) by mouth every 6 (six) hours as needed. Patient not taking: Reported on 12/07/2017 12/31/16   Bethann Berkshire, MD    Family History History reviewed. No pertinent family history.  Social History Social History   Tobacco Use  . Smoking status: Never Smoker  . Smokeless tobacco: Never Used  Substance Use Topics  . Alcohol use: No  . Drug use: No     Allergies     Patient has no known allergies.   Review of Systems Review of Systems  Reason unable to perform ROS: See HPI as above.     Physical Exam Triage Vital Signs ED Triage Vitals  Enc Vitals Group     BP 12/07/17 1245 (!) 111/55     Pulse Rate 12/07/17 1245 (!) 108     Resp 12/07/17 1245 18     Temp 12/07/17 1245 98.2 F (36.8 C)     Temp Source 12/07/17 1245 Oral     SpO2 12/07/17 1245 97 %     Weight --      Height --      Head Circumference --      Peak Flow --      Pain Score 12/07/17 1246 10     Pain Loc --      Pain Edu? --      Excl. in GC? --    No data found.  Updated Vital Signs BP (!) 111/55 (BP Location: Left Arm)   Pulse (!) 108   Temp 98.2 F (36.8 C) (Oral)   Resp 18   LMP 02/12/2017   SpO2 97%   Breastfeeding? Yes   Visual Acuity Right Eye Distance:   Left Eye Distance:   Bilateral Distance:    Right Eye Near:   Left Eye Near:    Bilateral Near:     Physical Exam  Constitutional: She is oriented to person, place, and time. She appears well-developed and well-nourished. No distress.  HENT:  Head: Normocephalic and atraumatic.  Right Ear: Tympanic membrane, external ear and ear canal normal. Tympanic membrane is not erythematous and not bulging.  Left Ear: Tympanic membrane, external ear and ear canal normal. Tympanic membrane is not erythematous and not bulging.  Nose: Nose normal. Right sinus exhibits no maxillary sinus tenderness and no frontal sinus tenderness. Left sinus exhibits no maxillary sinus tenderness and no frontal sinus tenderness.  Mouth/Throat: Uvula is midline, oropharynx is clear and moist and mucous membranes are normal.  Eyes: Pupils are equal, round, and reactive to light. Conjunctivae are normal.  Neck: Normal range of motion. Neck supple.  Cardiovascular: Normal rate, regular rhythm and normal heart sounds. Exam reveals no gallop and no friction rub.  No murmur heard. Pulmonary/Chest: Effort normal and breath sounds  normal. She has no decreased breath sounds. She has no wheezes. She has no rhonchi. She has no rales.  Lymphadenopathy:    She has no cervical adenopathy.  Neurological: She is alert and oriented to person, place, and time.  Skin: Skin is warm and dry.  Psychiatric: She has a normal mood and affect. Her behavior is normal. Judgment normal.     UC Treatments / Results  Labs (all labs ordered are listed, but only abnormal results are displayed) Labs Reviewed - No data to display  EKG None  Radiology No results found.  Procedures Procedures (including critical care time)  Medications Ordered in UC Medications  albuterol (PROVENTIL HFA;VENTOLIN HFA) 108 (90 Base) MCG/ACT inhaler 2 puff (2 puffs Inhalation Given 12/07/17 1330)    Initial Impression / Assessment and Plan / UC Course  I have reviewed the triage vital signs and the nursing notes.  Pertinent labs & imaging results that were available during my care of the patient were reviewed by me and considered in my medical decision making (see chart for details).    Discussed possible viral illness. Possible post nasal drip causing cough. Will provide albuterol and other symptomatic treatment. Discussed case with Dr Milus GlazierLauenstein, who suggestion low dose prednisone to help with symptoms. Patient would like to check with OB, and appointment is later today, will provide written Rx. Return precautions given.  Final Clinical Impressions(s) / UC Diagnoses   Final diagnoses:  Cough    ED Prescriptions    Medication Sig Dispense Auth. Provider   fluticasone (FLONASE) 50 MCG/ACT nasal spray Place 2 sprays into both nostrils daily. 1 g Nakeysha Pasqual V, PA-C   ipratropium (ATROVENT) 0.06 % nasal spray Place 2 sprays into both nostrils 4 (four) times daily. 15 mL Nicoletta Hush V, PA-C   predniSONE (DELTASONE) 10 MG tablet Take 1 tablet (10 mg total) by mouth daily with breakfast. 5 tablet Threasa AlphaYu, Camille Dragan V, PA-C        Gabriell Casimir V, [place] JerseyPA-C 12/07/17 1429

## 2017-12-07 NOTE — ED Triage Notes (Signed)
Pt c/o dry cough for several days.

## 2017-12-07 NOTE — Discharge Instructions (Signed)
As discussed, check with your OB about prednisone use, if cleared, can take for cough. You can also try albuterol inhaler for the symptoms. Start flonase, atrovent nasal spray for nasal congestion/drainage. You can use over the counter nasal saline rinse such as neti pot for nasal congestion. Keep hydrated, your urine should be clear to pale yellow in color. Tylenol/motrin for fever and pain. Follow up with OB as scheduled.

## 2017-12-12 LAB — OB RESULTS CONSOLE GBS: STREP GROUP B AG: POSITIVE

## 2017-12-19 LAB — OB RESULTS CONSOLE GC/CHLAMYDIA: Chlamydia: POSITIVE

## 2017-12-20 ENCOUNTER — Encounter (HOSPITAL_COMMUNITY): Payer: Self-pay | Admitting: *Deleted

## 2017-12-28 ENCOUNTER — Encounter (HOSPITAL_COMMUNITY)
Admission: RE | Admit: 2017-12-28 | Discharge: 2017-12-28 | Disposition: A | Discharge status: Home / Self Care | Payer: 59 | Source: Ambulatory Visit | Attending: Obstetrics and Gynecology | Admitting: Obstetrics and Gynecology

## 2017-12-28 ENCOUNTER — Encounter (HOSPITAL_COMMUNITY): Payer: Self-pay | Admitting: Obstetrics and Gynecology

## 2017-12-28 DIAGNOSIS — Z98891 History of uterine scar from previous surgery: Secondary | ICD-10-CM

## 2017-12-28 HISTORY — DX: History of uterine scar from previous surgery: Z98.891

## 2017-12-28 LAB — CBC
HCT: 34.5 % — ABNORMAL LOW (ref 36.0–46.0)
Hemoglobin: 11.2 g/dL — ABNORMAL LOW (ref 12.0–15.0)
MCH: 29.6 pg (ref 26.0–34.0)
MCHC: 32.5 g/dL (ref 30.0–36.0)
MCV: 91.3 fL (ref 80.0–100.0)
Platelets: 129 10*3/uL — ABNORMAL LOW (ref 150–400)
RBC: 3.78 MIL/uL — ABNORMAL LOW (ref 3.87–5.11)
RDW: 14.4 % (ref 11.5–15.5)
WBC: 9.2 10*3/uL (ref 4.0–10.5)
nRBC: 0 % (ref 0.0–0.2)

## 2017-12-28 LAB — TYPE AND SCREEN
ABO/RH(D): O POS
Antibody Screen: NEGATIVE

## 2017-12-28 NOTE — H&P (Signed)
Kristen Gomez is a 22 y.o. female G2P1001 at 39wk for rLTCS.  Pregnancy dated by first trimester [place].  Prenatal care complicated by + chl at 36 week - treated.  Has h/o LTCS and desires repeat.   D/W pt r/b/a of LTCS.  Had previa - repeat US previa resolved.    OB History    Gravida  2   Para  1   Term  1   Preterm      AB      Living  1     SAB      TAB      Ectopic      Multiple  0   Live Births  1         no abn pap + Chl in preg - 36 wk  G1 09/2014 female 8# LTCS G2 present  Past Medical History:  Diagnosis Date  . Anxiety   . Headache   . History of cesarean section, low transverse 12/28/2017  . S/P cesarean section 09/30/2014  . UTI (urinary tract infection)    Past Surgical History:  Procedure Laterality Date  . CESAREAN SECTION N/A 09/30/2014   Procedure: CESAREAN SECTION;  Surgeon: Janyth Contes, MD;  Location: [facility];  Service: Obstetrics;  Laterality: N/A;  . TONSILLECTOMY     Family History: family history is not on file. Social History:  reports that she has never smoked. She has never used smokeless tobacco. She reports that she does not drink alcohol or use drugs. single, call-senter  Meds fluticasone, PNV All NKDA     Maternal Diabetes: No Genetic Screening: Normal Maternal Ultrasounds/Referrals: Normal Fetal Ultrasounds or other Referrals:  None Maternal Substance Abuse:  No Significant Maternal Medications:  None Significant Maternal Lab Results:  Lab values include: Group B Strep positive Other Comments:  previa resolved, +Chl at 36 weeks - treated  Review of Systems  Constitutional: Negative.   HENT: Negative.   Eyes: Negative.   Respiratory: Negative.   Cardiovascular: Negative.   Gastrointestinal: Negative.   Genitourinary: Negative.   Musculoskeletal: Negative.   Skin: Negative.   Neurological: Negative.   Psychiatric/Behavioral: Negative.    Maternal Medical History:  Contractions: Frequency: irregular.     Fetal activity: Perceived fetal activity is normal.    Prenatal complications: Chl at 36 weeks - treated  Prenatal Complications - Diabetes: none.      Last menstrual period 02/12/2017, currently breastfeeding. Maternal Exam:  Uterine Assessment: Contraction frequency is irregular.   Abdomen: Surgical scars: low transverse.   Fundal height is appropriate for gestation.   Estimated fetal weight is 8-8.5#.   Fetal presentation: vertex  Introitus: Normal vulva. Normal vagina.    Physical Exam  Constitutional: She is oriented to person, place, and time. She appears well-developed and well-nourished.  HENT:  Head: Normocephalic and atraumatic.  Cardiovascular: Normal rate and regular rhythm.  Respiratory: Breath sounds normal. No respiratory distress. She has no wheezes.  GI: Soft. Bowel sounds are normal. She exhibits no distension. There is no abdominal tenderness.  Genitourinary:    Vulva normal.   Musculoskeletal: Normal range of motion.  Neurological: She is alert and oriented to person, place, and time.  Skin: Skin is warm and dry.  Psychiatric: She has a normal mood and affect. Her behavior is normal.    Prenatal labs: ABO, Rh: O/Positive/-- (06/03 0000) Antibody: n (06/03 0000) Rubella: Immune, Nonimmune (06/03 0000) RPR: Nonreactive, Nonreactive (06/03 0000)  HBsAg: Negative, Negative (06/03 0000)  HIV:  Non-reactive, Non-reactive (06/03 0000)  GBS: Positive (11/27 0000)  Hgb 11.7/Plt 270/Ur Cx neg neg/Chl neg at w/u; + at 36 week/GC neg/Varicella non immune/Rubella nonimmune/Hgb electro WNL/First trimester screen WNL/AFP WNL/glucola 99/ Nl anat/post previa female Previa resolved  Assessment/Plan: 21yoG2P1001 at 39wk for rLTCS  Ancef for prophylaxis D/w pt r/b/a of rLTCS Rubella nonimmune - MMR PP Varicella nonimmune - Varicella vaccine PP Declined Tdap and Flu vaccine  Kristen Gomez 12/28/2017, 3:03 PM

## 2017-12-28 NOTE — Patient Instructions (Signed)
Tawni Levyyequiere Roesler  12/28/2017   Your procedure is scheduled on:  12/31/2017  [facility]  Pick up the phone at the desk and dial [phone]  Call this number if you have problems the morning of surgery:[phone]  Remember:   Do not eat food:(After Midnight) Desps de medianoche.  Do not drink clear liquids: (After Midnight) Desps de medianoche.  Take these medicines the morning of surgery with A SIP OF WATER: none   Do not wear jewelry, make-up or nail polish.  Do not wear lotions, powders, or perfumes. Do not wear deodorant.  Do not shave 48 hours prior to surgery.  Do not bring valuables to the hospital.  [facility] is not   responsible for any belongings or valuables brought to the hospital.  Contacts, dentures or bridgework may not be worn into surgery.  Leave suitcase in the car. After surgery it may be brought to your room.  For patients admitted to the hospital, checkout time is 11:00 AM the day of              discharge.    N/A   Please read over the following fact sheets that you were given:   Surgical Site Infection Prevention

## 2017-12-28 NOTE — Pre-Procedure Instructions (Signed)
Odonno notified of platelet count order received.  Unable to place order in computer.  Note placed in Yellow alert box on pts chart.

## 2017-12-31 ENCOUNTER — Inpatient Hospital Stay (HOSPITAL_COMMUNITY): Payer: 59

## 2017-12-31 ENCOUNTER — Encounter (HOSPITAL_COMMUNITY): Payer: Self-pay | Admitting: *Deleted

## 2017-12-31 ENCOUNTER — Inpatient Hospital Stay (HOSPITAL_COMMUNITY)
Admission: AD | Admit: 2017-12-31 | Discharge: 2018-01-02 | DRG: 788 | Disposition: A | Discharge status: Home / Self Care | Payer: 59 | Attending: Obstetrics and Gynecology | Admitting: Obstetrics and Gynecology

## 2017-12-31 ENCOUNTER — Encounter (HOSPITAL_COMMUNITY)
Admission: AD | Disposition: A | Discharge status: Home / Self Care | Payer: Self-pay | Source: Home / Self Care | Attending: Obstetrics and Gynecology

## 2017-12-31 DIAGNOSIS — O34211 Maternal care for low transverse scar from previous cesarean delivery: Secondary | ICD-10-CM | POA: Diagnosis present

## 2017-12-31 DIAGNOSIS — Z3A39 39 weeks gestation of pregnancy: Secondary | ICD-10-CM

## 2017-12-31 DIAGNOSIS — O99824 Streptococcus B carrier state complicating childbirth: Secondary | ICD-10-CM | POA: Diagnosis present

## 2017-12-31 DIAGNOSIS — Z98891 History of uterine scar from previous surgery: Secondary | ICD-10-CM

## 2017-12-31 HISTORY — DX: History of uterine scar from previous surgery: Z98.891

## 2017-12-31 SURGERY — Surgical Case
Anesthesia: Spinal

## 2017-12-31 MED ORDER — ONDANSETRON HCL 4 MG/2ML IJ SOLN
INTRAMUSCULAR | Status: DC | PRN
  Administered 2017-12-31: 4 mg via INTRAVENOUS

## 2017-12-31 MED ORDER — DIPHENHYDRAMINE HCL 25 MG PO CAPS: 25.0000 mg | ORAL_CAPSULE | Freq: Four times a day (QID) | ORAL | Status: DC | PRN

## 2017-12-31 MED ORDER — ONDANSETRON HCL 4 MG/2ML IJ SOLN: 4.0000 mg | Freq: Three times a day (TID) | INTRAMUSCULAR | Status: DC | PRN

## 2017-12-31 MED ORDER — PHENYLEPHRINE 8 MG IN D5W 100 ML (0.08MG/ML) PREMIX OPTIME
INJECTION | INTRAVENOUS | Status: DC | PRN
  Administered 2017-12-31: 60 ug/min via INTRAVENOUS

## 2017-12-31 MED ORDER — KETOROLAC TROMETHAMINE 30 MG/ML IJ SOLN: 30.0000 mg | Freq: Four times a day (QID) | INTRAMUSCULAR | Status: AC | PRN

## 2017-12-31 MED ORDER — GLYCOPYRROLATE 0.2 MG/ML IJ SOLN
INTRAMUSCULAR | Status: AC
  Filled 2017-12-31: qty 1

## 2017-12-31 MED ORDER — PHENYLEPHRINE HCL 10 MG/ML IJ SOLN
INTRAMUSCULAR | Status: DC | PRN
  Administered 2017-12-31: 80 ug via INTRAVENOUS

## 2017-12-31 MED ORDER — FENTANYL CITRATE (PF) 100 MCG/2ML IJ SOLN
INTRAMUSCULAR | Status: DC | PRN
  Administered 2017-12-31: 15 ug via INTRAVENOUS

## 2017-12-31 MED ORDER — CEFAZOLIN SODIUM-DEXTROSE 2-4 GM/100ML-% IV SOLN
2.0000 g | INTRAVENOUS | Status: AC
  Administered 2017-12-31: 2 g via INTRAVENOUS
  Filled 2017-12-31: qty 100

## 2017-12-31 MED ORDER — KETOROLAC TROMETHAMINE 30 MG/ML IJ SOLN
30.0000 mg | Freq: Four times a day (QID) | INTRAMUSCULAR | Status: AC | PRN
  Administered 2017-12-31 – 2018-01-01 (×3): 30 mg via INTRAVENOUS
  Filled 2017-12-31 (×3): qty 1

## 2017-12-31 MED ORDER — DEXAMETHASONE SODIUM PHOSPHATE 4 MG/ML IJ SOLN
INTRAMUSCULAR | Status: AC
  Filled 2017-12-31: qty 1

## 2017-12-31 MED ORDER — MORPHINE SULFATE (PF) 0.5 MG/ML IJ SOLN
INTRAMUSCULAR | Status: DC | PRN
  Administered 2017-12-31: .15 mg via EPIDURAL

## 2017-12-31 MED ORDER — TETANUS-DIPHTH-ACELL PERTUSSIS 5-2.5-18.5 LF-MCG/0.5 IM SUSP: 0.5000 mL | Freq: Once | INTRAMUSCULAR | Status: DC

## 2017-12-31 MED ORDER — DIPHENHYDRAMINE HCL 50 MG/ML IJ SOLN: 12.5000 mg | INTRAMUSCULAR | Status: DC | PRN

## 2017-12-31 MED ORDER — IBUPROFEN 800 MG PO TABS
800.0000 mg | ORAL_TABLET | Freq: Three times a day (TID) | ORAL | Status: DC
  Administered 2018-01-01 – 2018-01-02 (×5): 800 mg via ORAL
  Filled 2017-12-31 (×5): qty 1

## 2017-12-31 MED ORDER — SIMETHICONE 80 MG PO CHEW
80.0000 mg | CHEWABLE_TABLET | Freq: Three times a day (TID) | ORAL | Status: DC
  Administered 2018-01-01 – 2018-01-02 (×4): 80 mg via ORAL
  Filled 2017-12-31 (×4): qty 1

## 2017-12-31 MED ORDER — LACTATED RINGERS IV SOLN
INTRAVENOUS | Status: DC
  Administered 2017-12-31 (×2): via INTRAVENOUS

## 2017-12-31 MED ORDER — FENTANYL CITRATE (PF) 100 MCG/2ML IJ SOLN: 25.0000 ug | INTRAMUSCULAR | Status: DC | PRN

## 2017-12-31 MED ORDER — NALOXONE HCL 0.4 MG/ML IJ SOLN: 0.4000 mg | INTRAMUSCULAR | Status: DC | PRN

## 2017-12-31 MED ORDER — OXYTOCIN 10 UNIT/ML IJ SOLN: INTRAMUSCULAR | Status: DC | PRN

## 2017-12-31 MED ORDER — SODIUM CHLORIDE 0.9% FLUSH: 3.0000 mL | INTRAVENOUS | Status: DC | PRN

## 2017-12-31 MED ORDER — NALBUPHINE HCL 10 MG/ML IJ SOLN: 5.0000 mg | Freq: Once | INTRAMUSCULAR | Status: DC | PRN

## 2017-12-31 MED ORDER — FENTANYL BOLUS VIA INFUSION: 50.0000 ug | Freq: Once | INTRAVENOUS | Status: DC

## 2017-12-31 MED ORDER — ZOLPIDEM TARTRATE 5 MG PO TABS: 5.0000 mg | ORAL_TABLET | Freq: Every evening | ORAL | Status: DC | PRN

## 2017-12-31 MED ORDER — LACTATED RINGERS IV SOLN
INTRAVENOUS | Status: DC
  Administered 2018-01-01: 01:00:00 via INTRAVENOUS

## 2017-12-31 MED ORDER — COCONUT OIL OIL: 1.0000 "application " | TOPICAL_OIL | Status: DC | PRN

## 2017-12-31 MED ORDER — NALBUPHINE HCL 10 MG/ML IJ SOLN
5.0000 mg | INTRAMUSCULAR | Status: DC | PRN
  Administered 2017-12-31: 5 mg via INTRAVENOUS
  Filled 2017-12-31: qty 1

## 2017-12-31 MED ORDER — LACTATED RINGERS IV SOLN: INTRAVENOUS | Status: DC

## 2017-12-31 MED ORDER — LACTATED RINGERS IV SOLN
INTRAVENOUS | Status: DC | PRN
  Administered 2017-12-31: 10:00:00 via INTRAVENOUS

## 2017-12-31 MED ORDER — BUPIVACAINE IN DEXTROSE 0.75-8.25 % IT SOLN
INTRATHECAL | Status: DC | PRN
  Administered 2017-12-31: 1.4 mL via INTRATHECAL

## 2017-12-31 MED ORDER — SODIUM CHLORIDE 0.9 % IR SOLN
Status: DC | PRN
  Administered 2017-12-31: 1000 mL

## 2017-12-31 MED ORDER — METOCLOPRAMIDE HCL 5 MG/ML IJ SOLN: 10.0000 mg | Freq: Once | INTRAMUSCULAR | Status: DC | PRN

## 2017-12-31 MED ORDER — NALBUPHINE HCL 10 MG/ML IJ SOLN: 5.0000 mg | INTRAMUSCULAR | Status: DC | PRN

## 2017-12-31 MED ORDER — GLYCOPYRROLATE 0.2 MG/ML IJ SOLN
INTRAMUSCULAR | Status: DC | PRN
  Administered 2017-12-31: 0.2 mg via INTRAVENOUS

## 2017-12-31 MED ORDER — FENTANYL CITRATE (PF) 100 MCG/2ML IJ SOLN
INTRAMUSCULAR | Status: AC
  Filled 2017-12-31: qty 2

## 2017-12-31 MED ORDER — DIPHENHYDRAMINE HCL 25 MG PO CAPS: 25.0000 mg | ORAL_CAPSULE | ORAL | Status: DC | PRN

## 2017-12-31 MED ORDER — OXYTOCIN 10 UNIT/ML IJ SOLN
INTRAVENOUS | Status: DC | PRN
  Administered 2017-12-31: 40 [IU] via INTRAVENOUS

## 2017-12-31 MED ORDER — DEXAMETHASONE SODIUM PHOSPHATE 4 MG/ML IJ SOLN
INTRAMUSCULAR | Status: DC | PRN
  Administered 2017-12-31: 4 mg via INTRAVENOUS

## 2017-12-31 MED ORDER — STERILE WATER FOR IRRIGATION IR SOLN
Status: DC | PRN
  Administered 2017-12-31: 1000 mL

## 2017-12-31 MED ORDER — MEPERIDINE HCL 25 MG/ML IJ SOLN: 6.2500 mg | INTRAMUSCULAR | Status: DC | PRN

## 2017-12-31 MED ORDER — SENNOSIDES-DOCUSATE SODIUM 8.6-50 MG PO TABS
2.0000 | ORAL_TABLET | ORAL | Status: DC
  Administered 2018-01-01 (×2): 2 via ORAL
  Filled 2017-12-31 (×2): qty 2

## 2017-12-31 MED ORDER — MENTHOL 3 MG MT LOZG: 1.0000 | LOZENGE | OROMUCOSAL | Status: DC | PRN

## 2017-12-31 MED ORDER — PRENATAL MULTIVITAMIN CH
1.0000 | ORAL_TABLET | Freq: Every day | ORAL | Status: DC
  Administered 2018-01-01 – 2018-01-02 (×2): 1 via ORAL
  Filled 2017-12-31 (×2): qty 1

## 2017-12-31 MED ORDER — SCOPOLAMINE 1 MG/3DAYS TD PT72
MEDICATED_PATCH | TRANSDERMAL | Status: DC | PRN
  Administered 2017-12-31: 1 via TRANSDERMAL

## 2017-12-31 MED ORDER — FENTANYL CITRATE (PF) 100 MCG/2ML IJ SOLN
50.0000 ug | Freq: Once | INTRAMUSCULAR | Status: AC
  Administered 2017-12-31: 50 ug via INTRAVENOUS
  Filled 2017-12-31: qty 2

## 2017-12-31 MED ORDER — SIMETHICONE 80 MG PO CHEW: 80.0000 mg | CHEWABLE_TABLET | ORAL | Status: DC | PRN

## 2017-12-31 MED ORDER — ONDANSETRON HCL 4 MG/2ML IJ SOLN
INTRAMUSCULAR | Status: AC
  Filled 2017-12-31: qty 2

## 2017-12-31 MED ORDER — DIBUCAINE 1 % RE OINT: 1.0000 "application " | TOPICAL_OINTMENT | RECTAL | Status: DC | PRN

## 2017-12-31 MED ORDER — WITCH HAZEL-GLYCERIN EX PADS: 1.0000 "application " | MEDICATED_PAD | CUTANEOUS | Status: DC | PRN

## 2017-12-31 MED ORDER — SIMETHICONE 80 MG PO CHEW
80.0000 mg | CHEWABLE_TABLET | ORAL | Status: DC
  Administered 2018-01-01 (×2): 80 mg via ORAL
  Filled 2017-12-31 (×2): qty 1

## 2017-12-31 MED ORDER — OXYTOCIN 10 UNIT/ML IJ SOLN
INTRAMUSCULAR | Status: AC
  Filled 2017-12-31: qty 4

## 2017-12-31 MED ORDER — SCOPOLAMINE 1 MG/3DAYS TD PT72
MEDICATED_PATCH | TRANSDERMAL | Status: AC
  Filled 2017-12-31: qty 1

## 2017-12-31 MED ORDER — NALOXONE HCL 4 MG/10ML IJ SOLN
1.0000 ug/kg/h | INTRAVENOUS | Status: DC | PRN
  Filled 2017-12-31: qty 5

## 2017-12-31 MED ORDER — OXYCODONE HCL 5 MG PO TABS
5.0000 mg | ORAL_TABLET | ORAL | Status: DC | PRN
  Administered 2017-12-31: 5 mg via ORAL
  Administered 2018-01-01 (×3): 10 mg via ORAL
  Administered 2018-01-01 – 2018-01-02 (×2): 5 mg via ORAL
  Administered 2018-01-02: 10 mg via ORAL
  Administered 2018-01-02: 5 mg via ORAL
  Filled 2017-12-31 (×2): qty 1
  Filled 2017-12-31 (×4): qty 2
  Filled 2017-12-31: qty 1
  Filled 2017-12-31: qty 2

## 2017-12-31 MED ORDER — SCOPOLAMINE 1 MG/3DAYS TD PT72: 1.0000 | MEDICATED_PATCH | Freq: Once | TRANSDERMAL | Status: DC

## 2017-12-31 MED ORDER — MORPHINE SULFATE (PF) 0.5 MG/ML IJ SOLN
INTRAMUSCULAR | Status: AC
  Filled 2017-12-31: qty 10

## 2017-12-31 MED ORDER — ACETAMINOPHEN 500 MG PO TABS
1000.0000 mg | ORAL_TABLET | Freq: Four times a day (QID) | ORAL | Status: AC
  Administered 2017-12-31 – 2018-01-01 (×4): 1000 mg via ORAL
  Filled 2017-12-31 (×4): qty 2

## 2017-12-31 MED ORDER — OXYTOCIN 40 UNITS IN LACTATED RINGERS INFUSION - SIMPLE MED: 2.5000 [IU]/h | INTRAVENOUS | Status: AC

## 2017-12-31 SURGICAL SUPPLY — 40 items
BENZOIN TINCTURE PRP APPL 2/3 (GAUZE/BANDAGES/DRESSINGS) ×3 IMPLANT
CHLORAPREP W/TINT 26ML (MISCELLANEOUS) ×3 IMPLANT
CLAMP CORD UMBIL (MISCELLANEOUS) IMPLANT
CLOSURE WOUND 1/2 X4 (GAUZE/BANDAGES/DRESSINGS) ×1
CLOTH BEACON ORANGE TIMEOUT ST (SAFETY) ×3 IMPLANT
DRESSING PREVENA PLUS CUSTOM (GAUZE/BANDAGES/DRESSINGS) ×1 IMPLANT
DRSG OPSITE POSTOP 4X10 (GAUZE/BANDAGES/DRESSINGS) ×3 IMPLANT
DRSG PREVENA PLUS CUSTOM (GAUZE/BANDAGES/DRESSINGS) ×3
ELECT REM PT RETURN 9FT ADLT (ELECTROSURGICAL) ×3
ELECTRODE REM PT RTRN 9FT ADLT (ELECTROSURGICAL) ×1 IMPLANT
EXTRACTOR VACUUM M CUP 4 TUBE (SUCTIONS) IMPLANT
EXTRACTOR VACUUM M CUP 4' TUBE (SUCTIONS)
GLOVE BIO SURGEON STRL SZ 6.5 (GLOVE) ×2 IMPLANT
GLOVE BIO SURGEONS STRL SZ 6.5 (GLOVE) ×1
GLOVE BIOGEL PI IND STRL 7.0 (GLOVE) ×1 IMPLANT
GLOVE BIOGEL PI INDICATOR 7.0 (GLOVE) ×2
GOWN STRL REUS W/TWL LRG LVL3 (GOWN DISPOSABLE) ×6 IMPLANT
KIT ABG SYR 3ML LUER SLIP (SYRINGE) IMPLANT
NEEDLE HYPO 25X5/8 SAFETYGLIDE (NEEDLE) IMPLANT
NS IRRIG 1000ML POUR BTL (IV SOLUTION) ×3 IMPLANT
PACK C SECTION WH (CUSTOM PROCEDURE TRAY) ×3 IMPLANT
PAD OB MATERNITY 4.3X12.25 (PERSONAL CARE ITEMS) ×3 IMPLANT
PENCIL SMOKE EVAC W/HOLSTER (ELECTROSURGICAL) ×3 IMPLANT
RETRACTOR TRAXI PANNICULUS (MISCELLANEOUS) ×1 IMPLANT
RTRCTR C-SECT PINK 25CM LRG (MISCELLANEOUS) ×3 IMPLANT
SPONGE LAP 18X18 RF (DISPOSABLE) ×9 IMPLANT
STRIP CLOSURE SKIN 1/2X4 (GAUZE/BANDAGES/DRESSINGS) ×2 IMPLANT
SUT MNCRL 0 VIOLET CTX 36 (SUTURE) ×2 IMPLANT
SUT MONOCRYL 0 CTX 36 (SUTURE) ×4
SUT PLAIN 1 NONE 54 (SUTURE) IMPLANT
SUT PLAIN 2 0 XLH (SUTURE) ×3 IMPLANT
SUT VIC AB 0 CT1 27 (SUTURE) ×4
SUT VIC AB 0 CT1 27XBRD ANBCTR (SUTURE) ×2 IMPLANT
SUT VIC AB 2-0 CT1 27 (SUTURE) ×2
SUT VIC AB 2-0 CT1 TAPERPNT 27 (SUTURE) ×1 IMPLANT
SUT VIC AB 4-0 KS 27 (SUTURE) ×3 IMPLANT
SYR BULB IRRIGATION 50ML (SYRINGE) ×3 IMPLANT
TOWEL OR 17X24 6PK STRL BLUE (TOWEL DISPOSABLE) ×3 IMPLANT
TRAXI PANNICULUS RETRACTOR (MISCELLANEOUS) ×2
TRAY FOLEY W/BAG SLVR 14FR LF (SET/KITS/TRAYS/PACK) ×3 IMPLANT

## 2017-12-31 NOTE — Brief Op Note (Signed)
12/31/2017  10:44 AM  PATIENT:  Kristen Gomez  22 y.o. female  PRE-OPERATIVE DIAGNOSIS:  repeat C-Section  POST-OPERATIVE DIAGNOSIS:  repeat c-section   PROCEDURE:  Procedure(s) with comments: REPEAT CESAREAN SECTION (N/A) - [name],  RNFA  SURGEON:  Surgeon(s) and Role:    * Bovard-Stuckert, Ashtyn Freilich, MD - Primary  ASSISTANTS: Krietemeyer, [name] RNFA   ANESTHESIA:   spinal  EBL:  400 mL IVF and uop per anesthesia, clear urine at end of procedure  FINDINGS: viable female infant at 9:47am, apgars 8/9, wt P; nl uterus, tubes and ovaries.    BLOOD ADMINISTERED:none  DRAINS: Urinary Catheter (Foley)   LOCAL MEDICATIONS USED:  NONE  SPECIMEN:  Source of Specimen:  Placenta  DISPOSITION OF SPECIMEN:  L&D  COUNTS:  YES  TOURNIQUET:  * No tourniquets in log *  DICTATION: .Other Dictation: Dictation Number [id number]  PLAN OF CARE: Admit to inpatient   PATIENT DISPOSITION:  PACU - hemodynamically stable.   Delay start of Pharmacological VTE agent (>24hrs) due to surgical blood loss or risk of bleeding: not applicable

## 2017-12-31 NOTE — Progress Notes (Signed)
Pt rating pain 7. Dr Acey Lavarignan Notified. Can give Tordalol 30 mg IV now as prn ordered.

## 2017-12-31 NOTE — Transfer of Care (Signed)
Immediate Anesthesia Transfer of Care Note  Patient: Kristen Gomez  Procedure(s) Performed: REPEAT CESAREAN SECTION (N/A )  Patient Location: PACU  Anesthesia Type:Spinal  Level of Consciousness: awake, alert  and oriented  Airway & Oxygen Therapy: Patient Spontanous Breathing  Post-op Assessment: Report given to RN and Post -op Vital signs reviewed and stable  Post vital signs: Reviewed and stable  Last Vitals:  Vitals Value Taken Time  BP    Temp    Pulse    Resp 12 12/31/2017 10:38 AM  SpO2    Vitals shown include unvalidated device data.  Last Pain:  Vitals:   12/31/17 0808  TempSrc: Oral  PainSc: 4          Complications: No apparent anesthesia complications

## 2017-12-31 NOTE — Op Note (Signed)
NAME: Tawni LevySMITH, Danae MEDICAL RECORD ZO:[id number] ACCOUNT [id number] DATE OF BIRTH:Mar 14, 1995 FACILITY: [facility] LOCATION: [facility] PH[facility]ICIAN:Audriella Blakeley Hinton RaoBOVARD-STUCKERT, MD  OPERATIVE REPORT  DATE OF PROCEDURE:  12/31/2017  PREOPERATIVE DIAGNOSES:  Intrauterine pregnancy at term, history of low transverse cesarean section, declines trial of labor.  POSTOPERATIVE DIAGNOSES:  Intrauterine pregnancy at term, history of low transverse cesarean section, declines trial of labor, delivered.  PROCEDURE:  Repeat low transverse cesarean section.  SURGEON:  Sherron MondayJody Bovard, M.D.  ASSISTANT:  Webb SilversmithHeather Krietemeyer, RNFA.  ANESTHESIA:  Spinal.  IV FLUIDS AND URINE OUTPUT:  Per Anesthesia with clear urine at the end of the procedure.  ESTIMATED BLOOD LOSS:  400 mL.  COMPLICATIONS:  None.  PATHOLOGY:  Placenta to labor and delivery.  FINDINGS:  Viable female infant at 9:47 a.m. with Apgars of 8 at 1 minute and 9 at 5 minutes and weight pending at the time of dictation.  Normal uterus, tubes, and ovaries were noted.  DESCRIPTION OF PROCEDURE:  After informed consent was reviewed with the patient and her partner.  She was transported to the operating room where spinal anesthesia was placed and found to be adequate.  She was then prepped and draped in the normal  sterile fashion.  After an appropriate timeout had been performed, a Pfannenstiel incision was made at the level of her previous incision and carried through to the underlying layer of fascia sharply and the fascia was incised in the midline and the  incision was extended laterally with the Bovie cautery.  The superior aspect of the fascial incision was grasped with Kocher clamps, elevated and the rectus muscles were dissected off both bluntly and sharply.  The midline was identified and the  peritoneum was entered bluntly.  This incision was extended superiorly and inferiorly with good visualization of the bladder, both sharply and bluntly.  A band of  adhesive tissue was incised with Bovie cautery.  The bladder flap was created mainly taking  down the fine adhesions.  The uterus was incised in a transverse fashion and the incision was extended manually.  The infant was delivered from a vertex presentation.  The cord was clamped and cut on the field after waiting a minute.  Nose and mouth  were suctioned and the infant was dried while awaiting this minute.  Infant was handed off to the waiting pediatric staff.  The placenta was expressed.  Uterus was cleared of all clot and debris.  The uterine incision was closed with 2 layers of 0  Monocryl, the first of which is a running locked and the second as an imbricating layer.  The uterus was made hemostatic with Bovie cautery.  The peritoneum was reapproximated using 2-0 Vicryl.  The subfascial planes were inspected and found to be  hemostatic.  The fascia was reapproximated with 0 Vicryl from either end overlapping in the midline.  The subcuticular adipose layer was made hemostatic with Bovie cautery and the dead space was closed with 3-0 plain gut.  The skin was reapproximated  with 4-0 Vicryl on a Keith needle.  Benzoin and the Prevena dressing were applied.    Sponge, lap and needle counts were correct x2 per the operating staff.  AN/NUANCE  D:12/31/2017 T:12/31/2017 JOB:[id number]

## 2017-12-31 NOTE — Anesthesia Preprocedure Evaluation (Signed)
Anesthesia Evaluation  Patient identified by MRN, date of birth, ID band Patient awake    Reviewed: Allergy & Precautions, NPO status , Patient's Chart, lab work & pertinent test results  Airway Mallampati: II  TM Distance: >3 FB Neck ROM: Full    Dental no notable dental hx.    Pulmonary neg pulmonary ROS,    Pulmonary exam normal breath sounds clear to auscultation       Cardiovascular negative cardio ROS Normal cardiovascular exam Rhythm:Regular Rate:Normal     Neuro/Psych negative neurological ROS  negative psych ROS   GI/Hepatic negative GI ROS, Neg liver ROS,   Endo/Other  negative endocrine ROS  Renal/GU negative Renal ROS  negative genitourinary   Musculoskeletal negative musculoskeletal ROS (+)   Abdominal   Peds negative pediatric ROS (+)  Hematology negative hematology ROS (+)   Anesthesia Other Findings   Reproductive/Obstetrics (+) Pregnancy                             Anesthesia Physical Anesthesia Plan  ASA: II  Anesthesia Plan: Spinal   Post-op Pain Management:    Induction:   PONV Risk Score and Plan: 3 and Ondansetron, Dexamethasone, Treatment may vary due to age or medical condition and Scopolamine patch - Pre-op  Airway Management Planned: Natural Airway  Additional Equipment:   Intra-op Plan:   Post-operative Plan:   Informed Consent: I have reviewed the patients History and Physical, chart, labs and discussed the procedure including the risks, benefits and alternatives for the proposed anesthesia with the patient or authorized representative who has indicated his/her understanding and acceptance.   Dental advisory given  Plan Discussed with:   Anesthesia Plan Comments:         Anesthesia Quick Evaluation

## 2017-12-31 NOTE — Anesthesia Procedure Notes (Signed)
Spinal  Patient location during procedure: OR Staffing Anesthesiologist: Mung Rinker, MD Performed: anesthesiologist  Preanesthetic Checklist Completed: patient identified, site marked, surgical consent, pre-op evaluation, timeout performed, IV checked, risks and benefits discussed and monitors and equipment checked Spinal Block Patient position: sitting Prep: DuraPrep Patient monitoring: heart rate, continuous pulse ox and blood pressure Approach: midline Location: L3-4 Injection technique: single-shot Needle Needle type: Sprotte  Needle gauge: 24 G Needle length: 9 cm Additional Notes Expiration date of kit checked and confirmed. Patient tolerated procedure well, without complications.       

## 2017-12-31 NOTE — Interval H&P Note (Signed)
History and Physical Interval Note:  12/31/2017 8:54 AM  Kristen Gomez  has presented today for surgery, with the diagnosis of repeat C-Section  The various methods of treatment have been discussed with the patient and family. After consideration of risks, benefits and other options for treatment, the patient has consented to  Procedure(s) with comments: REPEAT CESAREAN SECTION (N/A) - [name],  RNFA as a surgical intervention .  The patient's history has been reviewed, patient examined, no change in status, stable for surgery.  I have reviewed the patient's chart and labs.  Questions were answered to the patient's satisfaction.     [name]

## 2017-12-31 NOTE — Progress Notes (Signed)
Pt too drownsy from pain medication to ambulate safely at this time.

## 2018-01-01 ENCOUNTER — Other Ambulatory Visit: Payer: Self-pay

## 2018-01-01 ENCOUNTER — Encounter (HOSPITAL_COMMUNITY): Payer: Self-pay

## 2018-01-01 LAB — CBC
HCT: 29.4 % — ABNORMAL LOW (ref 36.0–46.0)
Hemoglobin: 9.5 g/dL — ABNORMAL LOW (ref 12.0–15.0)
MCH: 29.7 pg (ref 26.0–34.0)
MCHC: 32.3 g/dL (ref 30.0–36.0)
MCV: 91.9 fL (ref 80.0–100.0)
Platelets: 126 10*3/uL — ABNORMAL LOW (ref 150–400)
RBC: 3.2 MIL/uL — ABNORMAL LOW (ref 3.87–5.11)
RDW: 14.6 % (ref 11.5–15.5)
WBC: 11.7 10*3/uL — ABNORMAL HIGH (ref 4.0–10.5)
nRBC: 0 % (ref 0.0–0.2)

## 2018-01-01 LAB — BIRTH TISSUE RECOVERY COLLECTION (PLACENTA DONATION)

## 2018-01-01 NOTE — Lactation Note (Signed)
This note was copied from a baby's chart. Lactation Consultation Note  Patient Name: Kristen Gomez ZOXWR'UToday's Date: 01/01/2018 Reason for consult: Follow-up assessment;Term  P2 mother whose infant is now 331 hours old  Mother had no questions/concerns related to breast feeding.  She feels like baby has latched well and denies pain with latching.  Per mom, her breasts are soft and non tender and nipples are intact.  Mother's feeding choice is breast/bottle and plans to continue this plan after discharge.  Mother is familiar with hand expression and can obtain colostrum.  Mother will call for latch assistance as needed.   Maternal Data Formula Feeding for Exclusion: No Has patient been taught Hand Expression?: Yes Does the patient have breastfeeding experience prior to this delivery?: Yes  Feeding    LATCH Score                   Interventions    Lactation Tools Discussed/Used     Consult Status      Kristen Gomez 01/01/2018, 4:50 PM

## 2018-01-01 NOTE — Anesthesia Postprocedure Evaluation (Signed)
Anesthesia Post Note  Patient: Kristen Gomez  Procedure(s) Performed: REPEAT CESAREAN SECTION (N/A )     Patient location during evaluation: Mother Baby Anesthesia Type: Spinal Level of consciousness: awake Pain management: satisfactory to patient Vital Signs Assessment: post-procedure vital signs reviewed and stable Respiratory status: spontaneous breathing Cardiovascular status: stable Anesthetic complications: no    Last Vitals:  Vitals:   01/01/18 0015 01/01/18 0624  BP: (!) 97/54 (!) 97/51  Pulse: 70 85  Resp: 18 18  Temp: 36.7 C 36.9 C  SpO2: 98%     Last Pain:  Vitals:   01/01/18 0800  TempSrc:   PainSc: 5    Pain Goal: Patients Stated Pain Goal: 3 (12/31/17 1849)               Cephus ShellingBURGER,Elijah Phommachanh

## 2018-01-01 NOTE — Progress Notes (Signed)
MOB was referred for history of anxiety. * Referral screened out by Clinical Social Worker because none of the following criteria appear to apply: ~ History of anxiety/depression during this pregnancy, or of post-partum depression following prior delivery. (Per OB records review, no concerns of anxiety noted.) ~ Diagnosis of anxiety and/or depression within last 3 years (Per chart review, MOB's anxiety dates back to 2016) OR * MOB's symptoms currently being treated with medication and/or therapy. Please contact the Clinical Social Worker if needs arise, by MOB request, or if MOB scores greater than 9/yes to question 10 on Edinburgh Postpartum Depression Screen.  Kristen Gomez, LCSWA Clinical Social Worker [facility] Cell#: [phone] 

## 2018-01-01 NOTE — Lactation Note (Signed)
This note was copied from a baby's chart. Lactation Consultation Note  Patient Name: Kristen Gomez ZOXWR'UToday's Date: 01/01/2018 Reason for consult: Initial assessment;Term P2, 15 hour female infant, mom with c/s delivery. Per mom, infant had 3 voids and 1 stool since birth. Per mom, she breastfeed her 24 year old daughter for 2 weeks but stopped due issues with latching and low milk supply.  Per mom, she has medela DEBP at home.  Mom's feeding choice is breastfeeding and supplementing with formula. Per mom, she feels breastfeeding is going well and infant latches without problems. Per mom, she breastfeed infant [facility] , infant breastfeed for 20 minutes and then she supplemented with 2.5 ml of formula (Gerber Goodstart 20 kcal with iron). Mom demonstrated hand expression and colostrum present. LC discussed I & O. Mom will BF according hunger cues, 8 to 12 times within 24 hours including nights and not exceed 3 hours without breastfeeding infant. Mom made aware of O/P services, breastfeeding support groups, community resources, and our phone # for post-discharge questions.   Maternal Data Formula Feeding for Exclusion: Yes Reason for exclusion: Mother's choice to formula and breast feed on admission Has patient been taught Hand Expression?: Yes(Mom demostrated hand expression colostrum present both breast. ) Does the patient have breastfeeding experience prior to this delivery?: Yes  Feeding Feeding Type: Formula Nipple Type: Slow - flow  LATCH Score                   Interventions Interventions: Breast feeding basics reviewed  Lactation Tools Discussed/Used WIC Program: Yes   Consult Status Consult Status: Follow-up Date: 01/01/18 Follow-up type: In-patient    Danelle EarthlyRobin Thersia Petraglia 01/01/2018, 1:11 AM

## 2018-01-01 NOTE — Progress Notes (Signed)
Subjective: Postpartum Day 1: Cesarean Delivery Patient reports incisional pain and tolerating PO.    Objective: Vital signs in last 24 hours: Temp:  [97.5 F (36.4 C)-98.4 F (36.9 C)] 98.4 F (36.9 C) (12/17 0624) Pulse Rate:  [62-105] 85 (12/17 0624) Resp:  [11-19] 18 (12/17 0624) BP: (83-124)/(42-79) 97/51 (12/17 0624) SpO2:  [96 %-99 %] 98 % (12/17 0015) Weight:  [91.2 kg] 91.2 kg (12/16 11910808)  Physical Exam:  General: alert and no distress Lochia: appropriate Uterine Fundus: firm Incision: healing well DVT Evaluation: No evidence of DVT seen on physical exam.  Recent Labs    01/01/18 0520  HGB 9.5*  HCT 29.4*    Assessment/Plan: Status post Cesarean section. Doing well postoperatively.  Continue current care.  Thais Silberstein Bovard-Stuckert 01/01/2018, 7:35 AM

## 2018-01-02 LAB — RPR: RPR Ser Ql: NONREACTIVE

## 2018-01-02 MED ORDER — OXYCODONE HCL 5 MG PO TABS: 5.0000 mg | ORAL_TABLET | ORAL | 0 refills | Status: AC | PRN

## 2018-01-02 MED ORDER — IBUPROFEN 800 MG PO TABS: 800.0000 mg | ORAL_TABLET | Freq: Three times a day (TID) | ORAL | 1 refills | Status: DC | PRN

## 2018-01-02 NOTE — Discharge Instructions (Signed)
Nothing in vagina for 6 weeks.  No sex, tampons, and douching.  Other instructions as in [facility] Discharge Booklet. °

## 2018-01-02 NOTE — Progress Notes (Signed)
Paged Dr. Mindi SlickerBanga to inquire if removal of the wound vac  will be done at the 1 week follow up appointment?  Per Dr. Mindi SlickerBanga, pt can remove the wound vac when her baby is 67 days old, and Dr. Mindi SlickerBanga wants to see the patient in 2 weeks rather than in 1 week.  Discharge RN will inform patient.  Delice BisonBettie Stormy Connon RCharity fundraiser

## 2018-01-02 NOTE — Lactation Note (Signed)
This note was copied from a baby's chart. Lactation Consultation Note:  Mother reports that she is bottle feeding formula and breastfeeding.  Mother offered assistance with latching infant.  Mother denies need for assistance at this time.  Encouraged to continue to cue base feed infant and to breastfeed infant 8-12 times in 24 hours.  Discussed protecting mothers milk supply with additional pumping.  Mother has a harmony hand pump at the bedside.  Discussed treatment and prevention of engorgement.  Mother is aware of available LC services and community support.   Patient Name: Kristen Tawni Levyyequiere Fifita GNFAO'ZToday's Date: 01/02/2018 Reason for consult: Follow-up assessment   Maternal Data    Feeding Feeding Type: Formula Nipple Type: Slow - flow  LATCH Score                   Interventions    Lactation Tools Discussed/Used     Consult Status Consult Status: Complete    Michel BickersKendrick, Jouri Threat McCoy 01/02/2018, 1:24 PM

## 2018-01-02 NOTE — Discharge Summary (Signed)
OB Discharge Summary     Patient Name: Kristen Gomez DOB: May 15, 1995 MRN: [id number]  Date of admission: 12/31/2017 Delivering MD: Sherian Rein   Date of discharge: 01/02/2018  Admitting diagnosis: repeat C-Section Intrauterine pregnancy: [redacted]w[redacted]d     Secondary diagnosis:  Principal Problem:   History of cesarean section, low transverse Active Problems:   Status post repeat low transverse cesarean section  Additional problems: none     Discharge diagnosis: Term Pregnancy Delivered                                                                                                Post partum procedures:none  Augmentation: n/a  Complications: None  Hospital course:  Sceduled C/S   22 y.o. yo G2P2002 at [redacted]w[redacted]d was admitted to the hospital 12/31/2017 for scheduled cesarean section with the following indication:Elective Repeat.  Membrane Rupture Time/Date: 9:46 AM ,12/31/2017   Patient delivered a Viable infant.12/31/2017  Details of operation can be found in separate operative note.  Pateint had an uncomplicated postpartum course.  She is ambulating, tolerating a regular diet, passing flatus, and urinating well. Patient is discharged home in stable condition on  01/02/18         Physical exam  Vitals:   01/01/18 0624 01/01/18 1350 01/01/18 2300 01/02/18 0606  BP: (!) 97/51 (!) 100/51 (!) 84/55 (!) 91/52  Pulse: 85 96 88 79  Resp: 18 17 18 18   Temp: 98.4 F (36.9 C) 97.9 F (36.6 C) 98.1 F (36.7 C)   TempSrc: Oral Oral Oral   SpO2:  95%  97%  Weight:      Height:       General: alert, cooperative and no distress Lochia: appropriate Uterine Fundus: firm Incision: Dressing is clean, dry, and intact DVT Evaluation: No evidence of DVT seen on physical exam. Labs: Lab Results  Component Value Date   WBC 11.7 (H) 01/01/2018   HGB 9.5 (L) 01/01/2018   HCT 29.4 (L) 01/01/2018   MCV 91.9 01/01/2018   PLT 126 (L) 01/01/2018   CMP Latest Ref Rng & Units  12/31/2016  Glucose 65 - 99 mg/dL 94  BUN 6 - 20 mg/dL 11  Creatinine 4.69 - 6.29 mg/dL 5.28  Sodium 413 - 244 mmol/L 138  Potassium 3.5 - 5.1 mmol/L 3.6  Chloride 101 - 111 mmol/L 109  CO2 22 - 32 mmol/L 25  Calcium 8.9 - 10.3 mg/dL 0.1(U)  Total Protein 6.5 - 8.1 g/dL 6.9  Total Bilirubin 0.3 - 1.2 mg/dL 0.5  Alkaline Phos 38 - 126 U/L 67  AST 15 - 41 U/L 16  ALT 14 - 54 U/L 12(L)    Discharge instruction: per After Visit Summary and "Baby and Me Booklet".  After visit meds:  Allergies as of 01/02/2018   No Known Allergies     Medication List    STOP taking these medications   predniSONE 10 MG tablet Commonly known as:  DELTASONE     TAKE these medications   acetaminophen 500 MG tablet Commonly known as:  TYLENOL Take 1,000 mg by mouth every 6 (six)  hours as needed for headache.   ibuprofen 800 MG tablet Commonly known as:  ADVIL,MOTRIN Take 1 tablet (800 mg total) by mouth every 8 (eight) hours as needed for moderate pain or cramping.   IRON 27 240 (27 FE) MG tablet Generic drug:  ferrous gluconate Take 240 mg by mouth every other day.   oxyCODONE 5 MG immediate release tablet Commonly known as:  Oxy IR/ROXICODONE Take 1 tablet (5 mg total) by mouth every 4 (four) hours as needed for up to 7 days for severe pain or breakthrough pain.   prenatal multivitamin Tabs tablet Take 1 tablet by mouth daily at 12 noon.       Diet: routine diet  Activity: Advance as tolerated. Pelvic rest for 6 weeks.   Outpatient follow QM:VHQIup:call for appointment in 1 week for incision check and 6 weeks for postpartum visit Follow up Appt:No future appointments. Follow up Visit:No follow-ups on file.  Postpartum contraception: Not Discussed  Newborn Data: Live born female  Birth Weight: 7 lb 1.2 oz (3210 g) APGAR: 8, 9  Newborn Delivery   Birth date/time:  12/31/2017 09:47:00 Delivery type:  C-Section, Low Transverse Trial of labor:  No C-section categorization:  Repeat      Baby Feeding: Breast Disposition:home with mother   01/02/2018 Cathrine Musterecilia W Elizer Bostic, DO

## 2018-01-02 NOTE — Progress Notes (Signed)
Patient ID: Kristen Gomez, female   DOB: 1995-07-15, 22 y.o.   MRN: [id number] Pt doing well with no complaints. Ambulating and voiding well. Pain well controlled. Denies CP, fever, HA. Bonding well with baby. Breastfeeding. Feels ready for discharge to home today VSS ABD - FF and below umb, incision c/d/i EXT - mild edema, no homans  A/P: POD#2 s/p repeat ltc/s; elective - stable        Reviewed discharge instructions        F/u 1 week for incision check and 6 weeks for postpartum visit

## 2018-03-25 ENCOUNTER — Encounter (HOSPITAL_COMMUNITY): Payer: Self-pay

## 2018-03-25 ENCOUNTER — Ambulatory Visit (HOSPITAL_COMMUNITY)
Admission: EM | Admit: 2018-03-25 | Discharge: 2018-03-25 | Disposition: A | Discharge status: Home / Self Care | Payer: 59 | Attending: Family Medicine | Admitting: Family Medicine

## 2018-03-25 ENCOUNTER — Other Ambulatory Visit: Payer: Self-pay

## 2018-03-25 DIAGNOSIS — B9789 Other viral agents as the cause of diseases classified elsewhere: Secondary | ICD-10-CM

## 2018-03-25 DIAGNOSIS — J028 Acute pharyngitis due to other specified organisms: Secondary | ICD-10-CM

## 2018-03-25 NOTE — ED Triage Notes (Signed)
Pt cc sore throat x 2 days. pt has tried the OTC meds and nothing worked

## 2018-03-25 NOTE — Discharge Instructions (Signed)
You may use over the counter ibuprofen or acetaminophen as needed.  °For a sore throat, over the counter products such as [employer] Peroxyl Mouth Sore Rinse or Chloraseptic Sore Throat Spray may provide some temporary relief. ° ° ° ° °

## 2018-03-25 NOTE — ED Provider Notes (Signed)
[facility]ARE [facility]ENTER   [id number] 03/25/18 Arrival Time: 1820  ASSESSMENT & PLAN:  1. Sore throat (viral)    No sign of peritonsillar abscess. OTC analgesics and throat care as needed. Work note provided.   Discharge Instructions      You may use over the counter ibuprofen or acetaminophen as needed.   For a sore throat, over the counter products such as [employer] Peroxyl Mouth Sore Rinse or Chloraseptic Sore Throat Spray may provide some temporary relief.     Reviewed expectations re: course of current medical issues. Questions answered. Outlined signs and symptoms indicating need for more acute intervention. Patient verbalized understanding. After Visit Summary given.   SUBJECTIVE:  Kristen Gomez is a 23 y.o. female who reports a sore throat. Describes as sharp pain with swallowing. Onset abrupt beginning 2 days ago. No respiratory symptoms. Normal PO intake but reports discomfort with swallowing. Fever reported: no. No neck pain or swelling. No associated n/v/abdominal symptoms. Sick contacts: none. Tonsils removed as a child secondary to frequent strep throat.  OTC treatment: OTC "cold and flu" medication without much relief.  ROS: As per HPI.   OBJECTIVE:  Vitals:   03/25/18 1839 03/25/18 1840  BP: 100/68   Pulse: 98   Resp: 15   Temp: 98.5 F (36.9 C)   SpO2: 100%   Weight:  80.7 kg     General appearance: alert; no distress HEENT: throat with moderate erythema; no tonsils; uvula midline: yes Neck: supple with FROM; no lymphadenopathy CV: RRR Lungs: clear to auscultation bilaterally Abd: soft; non-tender Skin: reveals no rash; warm and dry Psychological: alert and cooperative; normal mood and affect  No Known Allergies  Past Medical History:  Diagnosis Date  . Anxiety   . Headache   . History of cesarean section, low transverse 12/28/2017  . S/P cesarean section 09/30/2014  . UTI (urinary tract infection)    Social History    Socioeconomic History  . Marital status: Single    Spouse name: Not on file  . Number of children: Not on file  . Years of education: Not on file  . Highest education level: Not on file  Occupational History  . Not on file  Social Needs  . Financial resource strain: Not hard at all  . Food insecurity:    Worry: Never true    Inability: Never true  . Transportation needs:    Medical: No    Non-medical: Not on file  Tobacco Use  . Smoking status: Never Smoker  . Smokeless tobacco: Never Used  Substance and Sexual Activity  . Alcohol use: No  . Drug use: No  . Sexual activity: Yes    Birth control/protection: Pill, None  Lifestyle  . Physical activity:    Days per week: Not on file    Minutes per session: Not on file  . Stress: Not at all  Relationships  . Social connections:    Talks on phone: Not on file    Gets together: Not on file    Attends religious service: Not on file    Active member of club or organization: Not on file    Attends meetings of clubs or organizations: Not on file    Relationship status: Not on file  . Intimate partner violence:    Fear of current or ex partner: No    Emotionally abused: No    Physically abused: No    Forced sexual activity: No  Other Topics Concern  . Not  on file  Social History Narrative  . Not on file   History reviewed. No pertinent family history.        Mardella Layman, MD 03/25/18 Windell Moment

## 2018-03-27 ENCOUNTER — Encounter (HOSPITAL_COMMUNITY): Payer: Self-pay | Admitting: Emergency Medicine

## 2018-03-27 ENCOUNTER — Other Ambulatory Visit: Payer: Self-pay

## 2018-03-27 ENCOUNTER — Emergency Department (HOSPITAL_COMMUNITY)
Admission: EM | Admit: 2018-03-27 | Discharge: 2018-03-27 | Disposition: A | Discharge status: Home / Self Care | Payer: 59 | Attending: Emergency Medicine | Admitting: Emergency Medicine

## 2018-03-27 DIAGNOSIS — J029 Acute pharyngitis, unspecified: Secondary | ICD-10-CM | POA: Diagnosis present

## 2018-03-27 DIAGNOSIS — Z79899 Other long term (current) drug therapy: Secondary | ICD-10-CM | POA: Diagnosis not present

## 2018-03-27 DIAGNOSIS — J020 Streptococcal pharyngitis: Secondary | ICD-10-CM

## 2018-03-27 LAB — WET PREP, GENITAL
Clue Cells Wet Prep HPF POC: NONE SEEN
Sperm: NONE SEEN
Trich, Wet Prep: NONE SEEN
WBC, Wet Prep HPF POC: NONE SEEN
Yeast Wet Prep HPF POC: NONE SEEN

## 2018-03-27 LAB — POC URINE PREG, ED: PREG TEST UR: NEGATIVE

## 2018-03-27 LAB — GROUP A STREP BY PCR: Group A Strep by PCR: DETECTED — AB

## 2018-03-27 MED ORDER — PENICILLIN V POTASSIUM 500 MG PO TABS: 500.0000 mg | ORAL_TABLET | Freq: Three times a day (TID) | ORAL | 0 refills | Status: AC

## 2018-03-27 MED ORDER — ONDANSETRON 4 MG PO TBDP: 4.0000 mg | ORAL_TABLET | Freq: Three times a day (TID) | ORAL | 0 refills | Status: DC | PRN

## 2018-03-27 MED ORDER — ONDANSETRON 4 MG PO TBDP
4.0000 mg | ORAL_TABLET | Freq: Once | ORAL | Status: AC
  Administered 2018-03-27: 4 mg via ORAL
  Filled 2018-03-27: qty 1

## 2018-03-27 NOTE — ED Notes (Signed)
Patient given juice.

## 2018-03-27 NOTE — ED Provider Notes (Signed)
[facility]-EMERGENCY DEPT Provider Note   CSN: [id number] Arrival date & time: 03/27/18  1037    History   Chief Complaint Chief Complaint  Patient presents with  . Sore Throat  . Otalgia    HPI Kristen Gomez is a 23 y.o. female.     HPI  Patient is a 23 year old female with a history of anxiety, headaches presenting for sore throat.  Patient reports she had a sore throat for the past 4 days.  Patient reports she finished presented urgent care, and felt to be viral.  Patient reports that she has had a tonsillectomy, and she is not been tested for strep throat during this current illness.  She reports that she has had difficulty swallowing to the sore throat.  Patient denies any neck induration or submandibular tenderness.  She denies any difficulty breathing.  Denies rhinorrhea, cough, or congestion.  She denies any recorded fevers at home.  No other known sick contacts, but patient does have multiple young children at home.  Patient also reports that she performs unprotected oral sex on a partner.  She has previously gotten gonorrhea and chlamydia from this partner most recently in December 2019.  Unknown if she has had a second exposure.  Past Medical History:  Diagnosis Date  . Anxiety   . Headache   . History of cesarean section, low transverse 12/28/2017  . S/P cesarean section 09/30/2014  . UTI (urinary tract infection)     Patient Active Problem List   Diagnosis Date Noted  . Status post repeat low transverse cesarean section 12/31/2017  . History of cesarean section, low transverse 12/28/2017  . S/P cesarean section 09/30/2014  . Labor and delivery, indication for care 09/29/2014    Past Surgical History:  Procedure Laterality Date  . CESAREAN SECTION N/A 09/30/2014   Procedure: CESAREAN SECTION;  Surgeon: Sherian Rein, MD;  Location: [facility] ORS;  Service: Obstetrics;  Laterality: N/A;  . CESAREAN SECTION N/A 12/31/2017   Procedure:  REPEAT CESAREAN SECTION;  Surgeon: Sherian Rein, MD;  Location: [facility] BIRTHING SU[facility]ES;  Service: Obstetrics;  Laterality: N/A;  [name],  RNFA  . TONSILLECTOMY       OB History    Gravida  2   Para  2   Term  2   Preterm      AB      Living  2     SAB      TAB      Ectopic      Multiple  0   Live Births  2            Home Medications    Prior to Admission medications   Medication Sig Start Date End Date Taking? Authorizing Provider  acetaminophen (TYLENOL) 500 MG tablet Take 1,000 mg by mouth every 6 (six) hours as needed for headache.    [provider]  ferrous gluconate (IRON 27) 240 (27 FE) MG tablet Take 240 mg by mouth every other day.    [provider]  ibuprofen (ADVIL,MOTRIN) 800 MG tablet Take 1 tablet (800 mg total) by mouth every 8 (eight) hours as needed for moderate pain or cramping. 01/02/18   Edwinna Areola, DO  Prenatal Vit-Fe Fumarate-FA (PRENATAL MULTIVITAMIN) TABS tablet Take 1 tablet by mouth daily at 12 noon.    [provider]    Family History No family history on file.  Social History Social History   Tobacco Use  . Smoking status: Never  Smoker  . Smokeless tobacco: Never Used  Substance Use Topics  . Alcohol use: No  . Drug use: No     Allergies   Patient has no known allergies.   Review of Systems Review of Systems  Constitutional: Negative for chills and fever.  HENT: Positive for sore throat. Negative for congestion, rhinorrhea, trouble swallowing and voice change.   Respiratory: Negative for cough, shortness of breath and stridor.   Gastrointestinal: Positive for nausea and vomiting.  Genitourinary: Negative for vaginal discharge.     Physical Exam Updated Vital Signs BP 106/68   Pulse (!) 111   Temp 98.4 F (36.9 C)   Resp 16   LMP 02/26/2018 (Approximate)   SpO2 100%   Physical Exam Vitals signs and nursing note reviewed.  Constitutional:      General: She is  not in acute distress.    Appearance: She is well-developed. She is not diaphoretic.     Comments: Sitting comfortably in bed.  HENT:     Head: Normocephalic and atraumatic.     Mouth/Throat:     Tonsils: Swelling: 0 on the right. 0 on the left.     Comments: Normal phonation. No muffled voice sounds. Patient swallows secretions without difficulty. Dentition normal. No lesions of tongue or buccal mucosa. Uvula midline. No asymmetric swelling of the posterior pharynx. Erythema of posterior pharynx. No exudate. Tonsils surgically absent. No lingual swelling. No induration inferior to tongue. No submandibular tenderness, swelling, or induration.  Tissues of the neck supple. No cervical lymphadenopathy. Right TM without erythema or effusion; left TM without erythema or effusion.  Eyes:     General:        Right eye: No discharge.        Left eye: No discharge.     Conjunctiva/sclera: Conjunctivae normal.     Comments: EOMs normal to gross examination.  Neck:     Musculoskeletal: Normal range of motion.  Cardiovascular:     Rate and Rhythm: Normal rate and regular rhythm.     Heart sounds: Normal heart sounds. No murmur.     Comments: Intact, 2+ radial pulse. Pulmonary:     Effort: Pulmonary effort is normal.     Breath sounds: Normal breath sounds. No wheezing or rales.  Abdominal:     General: There is no distension.  Musculoskeletal: Normal range of motion.  Skin:    General: Skin is warm and dry.  Neurological:     Mental Status: She is alert.     Comments: Cranial nerves intact to gross observation. Patient moves extremities without difficulty.  Psychiatric:        Behavior: Behavior normal.        Thought Content: Thought content normal.        Judgment: Judgment normal.      ED Treatments / Results  Labs (all labs ordered are listed, but only abnormal results are displayed) Labs Reviewed  GROUP A STREP BY PCR - Abnormal; Notable for the following components:       Result Value   Group A Strep by PCR DETECTED (*)    All other components within normal limits  WET PREP, GENITAL  GONOCOCCUS CULTURE  CHLAMYDIA CULTURE  POC URINE PREG, ED  GC/CHLAMYDIA PROBE AMP ([place]) NOT AT [facility]    EKG None  Radiology No results found.  Procedures Procedures (including critical care time)  Medications Ordered in ED Medications  ondansetron (ZOFRAN-ODT) disintegrating tablet 4 mg (4 mg Oral Given  03/27/18 1220)     Initial Impression / Assessment and Plan / ED Course  I have reviewed the triage vital signs and the nursing notes.  Pertinent labs & imaging results that were available during my care of the patient were reviewed by me and considered in my medical decision making (see chart for details).  Clinical Course as of Mar 26 1436  Wed Mar 27, 2018  1438 Will treat for strep.   Group A Strep by PCR(!): DETECTED [AM]    Clinical Course User Index [AM] Elisha Ponder, PA-C       Patient is nontoxic-appearing, afebrile, and in no acute distress.  She had one tachycardic pulse here in the emergency department after her throat swabs, however this normalized.  Patient has high risk sexual history for STI and was given history of tonsillectomy, overall low risk for strep throat, however given the severity of her symptoms and re-presentation, it is reasonable to test.  We will also test for gonorrhea and chlamydia of the throat.  No evidence of RPA or PTA on examination of the oropharynx.  Rapid strep is positive.  Will treat with penicillin.  Patient is tolerating p.o. in the emergency department with no acute distress.  Patient given return precautions for any difficulty breathing, swallowing, change in voice quality, or swelling.  Final Clinical Impressions(s) / ED Diagnoses   Final diagnoses:  Strep pharyngitis    ED Discharge Orders         Ordered    penicillin v potassium (VEETID) 500 MG tablet  3 times daily     03/27/18 1447     ondansetron (ZOFRAN ODT) 4 MG disintegrating tablet  Every 8 hours PRN     03/27/18 1447           Delia Chimes 03/27/18 1448    Charlynne Pander, MD 03/27/18 1555

## 2018-03-27 NOTE — Discharge Instructions (Addendum)
Please read and follow all provided instructions.  Your diagnoses today include:  1. Strep pharyngitis     Tests performed today include: Strep test: was POSITIVE for strep throat Vital signs. See below for your results today.   Medications prescribed:  Please take penicillin, 500 mg 3 times a day for 10 days.  Please take all of your antibiotics until finished.   You may develop abdominal discomfort or nausea from the antibiotic. If this occurs, you may take it with food. Some patients also get diarrhea with antibiotics. You may help offset this with probiotics which you can buy or get in yogurt. Do not eat or take the probiotics until 2 hours after your antibiotic. Some women develop vaginal yeast infections after antibiotics. If you develop unusual vaginal discharge after being on this medication, please see your primary care provider.   Some people develop allergies to antibiotics. Symptoms of antibiotic allergy can be mild and include a flat rash and itching. They can also be more serious and include:  ?Hives - Hives are raised, red patches of skin that are usually very itchy.  ?Lip or tongue swelling  ?Trouble swallowing or breathing  ?Blistering of the skin or mouth.  If you have any of these serious symptoms, please seek emergency medical care immediately.    Take any medications prescribed only as directed.   Home care instructions:  Please read the educational materials provided and follow any instructions contained in this packet.  Follow-up instructions: Please follow-up with your primary care provider as needed for further evaluation of your symptoms.  Return instructions:  Please return to the Emergency Department if you experience worsening symptoms.  Return if you have worsening problems swallowing, your neck becomes swollen, you cannot swallow your saliva or your voice becomes muffled.  Return with high persistent fever, persistent vomiting, or if you have  trouble breathing.  Please return if you have any other emergent concerns.  Additional Information:  Your vital signs today were: BP 106/68    Pulse (!) 111    Temp 98.4 F (36.9 C)    Resp 16    LMP 02/26/2018 (Approximate)    SpO2 100%  If your blood pressure (BP) was elevated above 135/85 this visit, please have this repeated by your doctor within one month.

## 2018-03-27 NOTE — ED Triage Notes (Signed)
Patient c/o sore throat x5 days with congestion and bilateral ear pain. Seen at [facility] for same x3 days ago.

## 2018-03-28 LAB — GC/CHLAMYDIA PROBE AMP (~~LOC~~) NOT AT ARMC
Chlamydia: NEGATIVE
NEISSERIA GONORRHEA: NEGATIVE

## 2018-03-29 LAB — GONOCOCCUS CULTURE: SPECIAL REQUESTS: NORMAL

## 2018-03-30 LAB — CHLAMYDIA CULTURE: Chlamydia Trachomatis Culture: NEGATIVE

## 2018-03-31 ENCOUNTER — Telehealth: Payer: Self-pay | Admitting: Emergency Medicine

## 2018-03-31 NOTE — Telephone Encounter (Signed)
Post ED Visit - Positive Culture Follow-up  Culture report reviewed by antimicrobial stewardship pharmacist: [facility] []  Enzo Bi, Pharm.D. []  Celedonio Miyamoto, Pharm.D., BCPS AQ-ID []  Garvin Fila, Pharm.D., BCPS []  Georgina Pillion, [address].D., BCPS []  [place], [address].D., BCPS, AAHIVP []  Estella Husk, Pharm.D., BCPS, AAHIVP []  Lysle Pearl, PharmD, BCPS []  Phillips Climes, PharmD, BCPS []  Agapito Games, PharmD, BCPS []  Verlan Friends, PharmD []  Mervyn Gay, PharmD, BCPS []  Vinnie Level, PharmD  [facility] []  Len Childs, PharmD []  Greer Pickerel, PharmD [x]  Adalberto Cole, PharmD []  Perlie Gold, Rph []  Lonell Face) Jean Rosenthal, PharmD []  Earl Many, PharmD []  Junita Push, PharmD []  Dorna Leitz, PharmD []  Terrilee Files, PharmD []  Lynann Beaver, PharmD []  Keturah Barre, PharmD []  Loralee Pacas, PharmD []  Bernadene Person, PharmD   Positive gonoccoccus culture Treated with Penicillin V Potassium, organism sensitive to the same and no further patient follow-up is required at this time.  Carollee Herter Sonam Wandel 03/31/2018, 3:42 PM

## 2018-06-11 ENCOUNTER — Ambulatory Visit (HOSPITAL_COMMUNITY)
Admission: EM | Admit: 2018-06-11 | Discharge: 2018-06-11 | Disposition: A | Discharge status: Home / Self Care | Payer: 59 | Attending: Family Medicine | Admitting: Family Medicine

## 2018-06-11 ENCOUNTER — Encounter (HOSPITAL_COMMUNITY): Payer: Self-pay

## 2018-06-11 ENCOUNTER — Other Ambulatory Visit: Payer: Self-pay

## 2018-06-11 DIAGNOSIS — Z113 Encounter for screening for infections with a predominantly sexual mode of transmission: Secondary | ICD-10-CM

## 2018-06-11 DIAGNOSIS — Z202 Contact with and (suspected) exposure to infections with a predominantly sexual mode of transmission: Secondary | ICD-10-CM | POA: Insufficient documentation

## 2018-06-11 MED ORDER — LIDOCAINE HCL (PF) 1 % IJ SOLN
INTRAMUSCULAR | Status: AC
  Filled 2018-06-11: qty 2

## 2018-06-11 MED ORDER — CEFTRIAXONE SODIUM 250 MG IJ SOLR
INTRAMUSCULAR | Status: AC
  Filled 2018-06-11: qty 250

## 2018-06-11 MED ORDER — CEFTRIAXONE SODIUM 250 MG IJ SOLR
250.0000 mg | Freq: Once | INTRAMUSCULAR | Status: AC
  Administered 2018-06-11: 250 mg via INTRAMUSCULAR

## 2018-06-11 NOTE — ED Provider Notes (Signed)
[facility]    CSN: [id number] Arrival date & time: 06/11/18  1427     History   Chief Complaint Chief Complaint  Patient presents with  . Exposure to STD    HPI Kristen Gomez is a 23 y.o. female.   Patient is a 23 year old female the presents today for exposure to STD.  Reports that partner tested positive for gonorrhea.  She is not currently having any symptoms.      Past Medical History:  Diagnosis Date  . Anxiety   . Headache   . History of cesarean section, low transverse 12/28/2017  . S/P cesarean section 09/30/2014  . UTI (urinary tract infection)     Patient Active Problem List   Diagnosis Date Noted  . Status post repeat low transverse cesarean section 12/31/2017  . History of cesarean section, low transverse 12/28/2017  . S/P cesarean section 09/30/2014  . Labor and delivery, indication for care 09/29/2014    Past Surgical History:  Procedure Laterality Date  . CESAREAN SECTION N/A 09/30/2014   Procedure: CESAREAN SECTION;  Surgeon: Sherian Rein, MD;  Location: [facility];  Service: Obstetrics;  Laterality: N/A;  . CESAREAN SECTION N/A 12/31/2017   Procedure: REPEAT CESAREAN SECTION;  Surgeon: Sherian Rein, MD;  Location: [facility];  Service: Obstetrics;  Laterality: N/A;  [name],  RNFA  . TONSILLECTOMY      OB History    Gravida  2   Para  2   Term  2   Preterm      AB      Living  2     SAB      TAB      Ectopic      Multiple  0   Live Births  2            Home Medications    Prior to Admission medications   Medication Sig Start Date End Date Taking? Authorizing Provider  norgestimate-ethinyl estradiol (ORTHO-CYCLEN) 0.25-35 MG-MCG tablet Take 1 tablet by mouth daily.   Yes [provider]  acetaminophen (TYLENOL) 500 MG tablet Take 1,000 mg by mouth every 6 (six) hours as needed for headache.    [provider]  ibuprofen (ADVIL,MOTRIN) 800 MG tablet Take 1 tablet  (800 mg total) by mouth every 8 (eight) hours as needed for moderate pain or cramping. 01/02/18   Edwinna Areola, DO    Family History Family History  Problem Relation Age of Onset  . Healthy Mother   . Healthy Father     Social History Social History   Tobacco Use  . Smoking status: Never Smoker  . Smokeless tobacco: Never Used  Substance Use Topics  . Alcohol use: No  . Drug use: No     Allergies   Patient has no known allergies.   Review of Systems Review of Systems  Genitourinary: Negative for decreased urine volume, difficulty urinating, dyspareunia, dysuria, enuresis, flank pain, frequency, genital sores, hematuria, menstrual problem, pelvic pain, urgency, vaginal bleeding, vaginal discharge and vaginal pain.     Physical Exam Triage Vital Signs ED Triage Vitals  Enc Vitals Group     BP 06/11/18 1454 109/60     Pulse Rate 06/11/18 1454 89     Resp 06/11/18 1454 18     Temp 06/11/18 1454 98.2 F (36.8 C)     Temp Source 06/11/18 1454 Oral     SpO2 06/11/18 1454 100 %     Weight --  Height --      Head Circumference --      Peak Flow --      Pain Score 06/11/18 1452 0     Pain Loc --      Pain Edu? --      Excl. in GC? --    No data found.  Updated Vital Signs BP 109/60 (BP Location: Left Arm)   Pulse 89   Temp 98.2 F (36.8 C) (Oral)   Resp 18   LMP 06/03/2018 (Exact Date)   SpO2 100%   Breastfeeding No   Visual Acuity Right Eye Distance:   Left Eye Distance:   Bilateral Distance:    Right Eye Near:   Left Eye Near:    Bilateral Near:     Physical Exam Vitals signs and nursing note reviewed.  Constitutional:      General: She is not in acute distress.    Appearance: Normal appearance. She is not ill-appearing, toxic-appearing or diaphoretic.  HENT:     Head: Normocephalic.     Nose: Nose normal.     Mouth/Throat:     Pharynx: Oropharynx is clear.  Eyes:     Conjunctiva/sclera: Conjunctivae normal.  Neck:      Musculoskeletal: Normal range of motion.  Pulmonary:     Effort: Pulmonary effort is normal.  Abdominal:     Palpations: Abdomen is soft.     Tenderness: There is no abdominal tenderness.  Musculoskeletal: Normal range of motion.  Skin:    General: Skin is warm and dry.     Findings: No rash.  Neurological:     Mental Status: She is alert.  Psychiatric:        Mood and Affect: Mood normal.      UC Treatments / Results  Labs (all labs ordered are listed, but only abnormal results are displayed) Labs Reviewed  CERVICOVAGINAL ANCILLARY ONLY    EKG None  Radiology No results found.  Procedures Procedures (including critical care time)  Medications Ordered in UC Medications  cefTRIAXone (ROCEPHIN) injection 250 mg (has no administration in time range)    Initial Impression / Assessment and Plan / UC Course  I have reviewed the triage vital signs and the nursing notes.  Pertinent labs & imaging results that were available during my care of the patient were reviewed by me and considered in my medical decision making (see chart for details).     Treating prophylactically for gonorrhea based on exposure. Swab sent for testing.  We will call with any positive results. Instructed to refrain from sexual activity for at least 7 days. Final Clinical Impressions(s) / UC Diagnoses   Final diagnoses:  None     Discharge Instructions     Treating prophylactically for gonorrhea today based on exposure. Sending your swab for testing.  We will call with any positive results. Refrain from sexual activity until you receive your results   ED Prescriptions    None     Controlled Substance Prescriptions [place] Controlled Substance Registry consulted? Not Applicable   Janace Aris, NP 06/11/18 1511

## 2018-06-11 NOTE — Discharge Instructions (Addendum)
Treating prophylactically for gonorrhea today based on exposure. Sending your swab for testing.  We will call with any positive results. Refrain from sexual activity until you receive your results

## 2018-06-11 NOTE — ED Triage Notes (Signed)
Patient presents to Urgent Care with complaints of needing STD screening since hearing from a partner that they tested positive for gonorrhea. Patient reports she does not have any symptoms.

## 2018-06-12 LAB — CERVICOVAGINAL ANCILLARY ONLY
Chlamydia: NEGATIVE
Neisseria Gonorrhea: POSITIVE — AB
Trichomonas: NEGATIVE

## 2018-06-13 ENCOUNTER — Telehealth (HOSPITAL_COMMUNITY): Payer: Self-pay | Admitting: Emergency Medicine

## 2018-06-13 NOTE — Telephone Encounter (Signed)
Gonorrhea is positive.  Patient should return as soon as possible to the urgent care for treatment with IM rocephin 250mg  and po zithromax 1g. Patient will not need to see a provider unless there are new symptoms she would like evaluated. Pt needs education to refrain from sexual intercourse for now and for 7 days after treatment to give the medicine time to work. Sexual partners need to be notified and tested/treated. Condoms may reduce risk of reinfection. [facility] notified.   Pt received rocephin shot on 5/26, without zithromax. Number is not in service. Will need to be retreated with both rocephin and zithromax.   Sent letter.

## 2018-06-24 ENCOUNTER — Ambulatory Visit (HOSPITAL_COMMUNITY)
Admission: EM | Admit: 2018-06-24 | Discharge: 2018-06-24 | Disposition: A | Discharge status: Home / Self Care | Payer: 59 | Attending: Family Medicine | Admitting: Family Medicine

## 2018-06-24 ENCOUNTER — Telehealth (HOSPITAL_COMMUNITY): Payer: Self-pay | Admitting: Emergency Medicine

## 2018-06-24 DIAGNOSIS — A549 Gonococcal infection, unspecified: Secondary | ICD-10-CM

## 2018-06-24 MED ORDER — AZITHROMYCIN 250 MG PO TABS
1000.0000 mg | ORAL_TABLET | Freq: Once | ORAL | Status: AC
  Administered 2018-06-24: 1000 mg via ORAL

## 2018-06-24 MED ORDER — AZITHROMYCIN 250 MG PO TABS
ORAL_TABLET | ORAL | Status: AC
  Filled 2018-06-24: qty 4

## 2018-06-24 MED ORDER — CEFTRIAXONE SODIUM 250 MG IJ SOLR
250.0000 mg | Freq: Once | INTRAMUSCULAR | Status: AC
  Administered 2018-06-24: 250 mg via INTRAMUSCULAR

## 2018-06-24 MED ORDER — CEFTRIAXONE SODIUM 250 MG IJ SOLR
INTRAMUSCULAR | Status: AC
  Filled 2018-06-24: qty 250

## 2018-06-24 NOTE — ED Notes (Signed)
Pt here for std treatment only 

## 2018-06-24 NOTE — Telephone Encounter (Signed)
Pt called stating she received a letter, will return today for treatment.

## 2018-09-09 ENCOUNTER — Emergency Department (HOSPITAL_COMMUNITY)
Admission: EM | Admit: 2018-09-09 | Discharge: 2018-09-09 | Disposition: A | Discharge status: Home / Self Care | Payer: 59 | Attending: Emergency Medicine | Admitting: Emergency Medicine

## 2018-09-09 ENCOUNTER — Other Ambulatory Visit: Payer: Self-pay

## 2018-09-09 ENCOUNTER — Encounter (HOSPITAL_COMMUNITY): Payer: Self-pay | Admitting: Emergency Medicine

## 2018-09-09 DIAGNOSIS — R51 Headache: Secondary | ICD-10-CM | POA: Insufficient documentation

## 2018-09-09 DIAGNOSIS — R110 Nausea: Secondary | ICD-10-CM | POA: Diagnosis not present

## 2018-09-09 DIAGNOSIS — Z79899 Other long term (current) drug therapy: Secondary | ICD-10-CM | POA: Insufficient documentation

## 2018-09-09 DIAGNOSIS — R519 Headache, unspecified: Secondary | ICD-10-CM

## 2018-09-09 MED ORDER — KETOROLAC TROMETHAMINE 30 MG/ML IJ SOLN
30.0000 mg | Freq: Once | INTRAMUSCULAR | Status: AC
  Administered 2018-09-09: 30 mg via INTRAVENOUS
  Filled 2018-09-09: qty 1

## 2018-09-09 MED ORDER — SODIUM CHLORIDE 0.9 % IV BOLUS
500.0000 mL | Freq: Once | INTRAVENOUS | Status: AC
  Administered 2018-09-09: 14:00:00 500 mL via INTRAVENOUS

## 2018-09-09 MED ORDER — ACETAMINOPHEN 500 MG PO TABS
1000.0000 mg | ORAL_TABLET | Freq: Once | ORAL | Status: AC
  Administered 2018-09-09: 1000 mg via ORAL
  Filled 2018-09-09: qty 2

## 2018-09-09 MED ORDER — BUTALBITAL-APAP-CAFFEINE 50-325-40 MG PO TABS: 1.0000 | ORAL_TABLET | Freq: Four times a day (QID) | ORAL | 0 refills | Status: DC | PRN

## 2018-09-09 MED ORDER — DEXAMETHASONE SODIUM PHOSPHATE 10 MG/ML IJ SOLN
10.0000 mg | Freq: Once | INTRAMUSCULAR | Status: AC
  Administered 2018-09-09: 10 mg via INTRAVENOUS
  Filled 2018-09-09: qty 1

## 2018-09-09 MED ORDER — METOCLOPRAMIDE HCL 5 MG/ML IJ SOLN
10.0000 mg | Freq: Once | INTRAMUSCULAR | Status: AC
  Administered 2018-09-09: 10 mg via INTRAVENOUS
  Filled 2018-09-09: qty 2

## 2018-09-09 NOTE — ED Provider Notes (Signed)
[facility]   CSN: [id number] Arrival date & time: 09/09/18  1153     History   Chief Complaint Chief Complaint  Patient presents with  . Migraine    HPI Kristen Gomez is a 23 y.o. female presents to the ER for evaluation of headache.  Onset yesterday evening.  Described as moderate 8/10, stabbing, to the forehead, constant.  Associated with photophobia, nausea.  Reports long history of "chronic migraines" since childhood.  States typically she will take ibuprofen or Excedrin and the symptoms will improve.  She took ibuprofen and she had no relief.  She denies any head trauma, anticoagulant use.  She denies any vision changes, diplopia, vomiting, neck pain or stiffness, fever, stroke symptoms.  She has never been seen by a neurologist for headaches.     HPI  Past Medical History:  Diagnosis Date  . Anxiety   . Headache   . History of cesarean section, low transverse 12/28/2017  . S/P cesarean section 09/30/2014  . UTI (urinary tract infection)     Patient Active Problem List   Diagnosis Date Noted  . Status post repeat low transverse cesarean section 12/31/2017  . History of cesarean section, low transverse 12/28/2017  . S/P cesarean section 09/30/2014  . Labor and delivery, indication for care 09/29/2014    Past Surgical History:  Procedure Laterality Date  . CESAREAN SECTION N/A 09/30/2014   Procedure: CESAREAN SECTION;  Surgeon: Sherian ReinJody Bovard-Stuckert, MD;  Location: [facility];  Service: Obstetrics;  Laterality: N/A;  . CESAREAN SECTION N/A 12/31/2017   Procedure: REPEAT CESAREAN SECTION;  Surgeon: Sherian ReinBovard-Stuckert, Jody, MD;  Location: [facility];  Service: Obstetrics;  Laterality: N/A;  [name],  RNFA  . TONSILLECTOMY       OB History    Gravida  2   Para  2   Term  2   Preterm      AB      Living  2     SAB      TAB      Ectopic      Multiple  0   Live Births  2            Home  Medications    Prior to Admission medications   Medication Sig Start Date End Date Taking? Authorizing Provider  acetaminophen (TYLENOL) 500 MG tablet Take 1,000 mg by mouth every 6 (six) hours as needed for headache.    [provider]  butalbital-acetaminophen-caffeine (FIORICET) [phone] [id number] MG tablet Take 1-2 tablets by mouth every 6 (six) hours as needed for headache. 09/09/18 09/09/19  Liberty HandyGibbons, Jenaro Souder J, PA-C  ibuprofen (ADVIL,MOTRIN) 800 MG tablet Take 1 tablet (800 mg total) by mouth every 8 (eight) hours as needed for moderate pain or cramping. 01/02/18   Edwinna AreolaBanga, Cecilia Worema, DO  norgestimate-ethinyl estradiol (ORTHO-CYCLEN) 0.25-35 MG-MCG tablet Take 1 tablet by mouth daily.    [provider]    Family History Family History  Problem Relation Age of Onset  . Healthy Mother   . Healthy Father     Social History Social History   Tobacco Use  . Smoking status: Never Smoker  . Smokeless tobacco: Never Used  Substance Use Topics  . Alcohol use: No  . Drug use: No     Allergies   Patient has no known allergies.   Review of Systems Review of Systems  Eyes: Positive for photophobia.  Gastrointestinal: Positive for nausea.  Neurological: Positive for  headaches.  All other systems reviewed and are negative.    Physical Exam Updated Vital Signs BP (!) 94/59 (BP Location: Right Arm)   Pulse 87   Temp 98.3 F (36.8 C) (Oral)   Resp 16   Ht 4\' 9"  (1.448 m)   Wt 77.1 kg   LMP 08/26/2018   SpO2 99%   BMI 36.79 kg/m   Physical Exam Constitutional:      Appearance: She is well-developed. She is not toxic-appearing.     Comments: NAD.  HENT:     Head: Normocephalic and atraumatic.     Comments: No temporal tenderness    Right Ear: External ear normal.     Left Ear: External ear normal.     Nose: Nose normal. No septal deviation or mucosal edema.  Eyes:     General: Lids are normal.     Conjunctiva/sclera: Conjunctivae normal.     Comments:  Unable to visualize back of eye  Neck:     Comments: No c spine spinous process or muscular tenderness  Full PROM of neck w/o rigidity  No meningeal signs  Cardiovascular:     Rate and Rhythm: Normal rate and regular rhythm.     Pulses:          Radial pulses are 2+ on the right side and 2+ on the left side.       Dorsalis pedis pulses are 2+ on the right side and 2+ on the left side.     Heart sounds: Normal heart sounds.  Pulmonary:     Effort: Pulmonary effort is normal.     Breath sounds: Normal breath sounds.  Lymphadenopathy:     Comments: No cervical adenopathy  Skin:    General: Skin is warm and dry.     Capillary Refill: Capillary refill takes less than 2 seconds.     Findings: No rash.  Neurological:     Mental Status: She is alert.     GCS: GCS eye subscore is 4. GCS verbal subscore is 5. GCS motor subscore is 6.     Comments:  Alert and oriented to self, place, time and event.  Speech is fluent without dysarthria or dysphasia. Strength 5/5 with hand grip and ankle F/E.   Sensation to light touch intact in face, hands and feet. Normal gait/sits on side of the bed without truncal sway No pronator drift. No leg drop. Normal finger-to-nose and feet tapping.  CN I not tested CN II grossly intact visual fields bilaterally. Unable to visualize posterior eye. CN III, IV, VI PEERL and EOMs intact bilaterally CN V light touch intact in all 3 divisions of trigeminal nerve CN VII facial movements symmetric CN VIII not tested CN IX, X no uvula deviation, symmetric rise of soft palate  CN XI 5/5 SCM and trapezius strength bilaterally  CN XII Midline tongue protrusion, symmetric L/R movements  Psychiatric:        Speech: Speech normal.        Behavior: Behavior normal.        Thought Content: Thought content normal.        Judgment: Judgment normal.      ED Treatments / Results  Labs (all labs ordered are listed, but only abnormal results are displayed) Labs Reviewed  - No data to display  EKG None  Radiology No results found.  Procedures Procedures (including critical care time)  Medications Ordered in ED Medications  sodium chloride 0.9 % bolus 500 mL (500  mLs Intravenous New Bag/Given 09/09/18 1334)  metoCLOPramide (REGLAN) injection 10 mg (10 mg Intravenous Given 09/09/18 1324)  ketorolac (TORADOL) 30 MG/ML injection 30 mg (30 mg Intravenous Given 09/09/18 1324)  dexamethasone (DECADRON) injection 10 mg (10 mg Intravenous Given 09/09/18 1324)  acetaminophen (TYLENOL) tablet 1,000 mg (1,000 mg Oral Given 09/09/18 1324)     Initial Impression / Assessment and Plan / ED Course  I have reviewed the triage vital signs and the nursing notes.  Pertinent labs & imaging results that were available during my care of the patient were reviewed by me and considered in my medical decision making (see chart for details).      Pt EMR reviewed to obtain pertinent PMH.  Ddx includes analgesic rebound headache vs tension type headache vs uncomplicated migraine.  Patient is without high-risk features of headache including: sudden/thunderclap HA, vision disturbances, AMS, seizure, fever, meningeal signs, worse with exertion or valsalva, age > 50, history of immunocompromise, use of anticoagulation, h/o aneurysms or AVMs, focal neuro deficits, elevated BP, trauma. On exam VS are WNL, pt is well-appearing w/ no meningismus, nystagmus, focal neuro deficits, pain over temporal arteries.  Given symptomatology and exam, doubt infectious etiology like meningitis, intracranial bleed, space occupying lesions, CVA, temporal arteritis.   Given reassuring hx and exam, emergent imaging or labs not indicated given. We will provide migraine cocktail and reassess.    Pt reassessed. Migraine cocktail provided moderate relief in HA.  Adequate for DC. Discussed s/s that would warrant return to ED. Pt verbalized understanding and agreeable with ED tx and dc plan.  DC with neurology f/u  and fioricet.  Recommended slow wean off NSAID to avoid analgesic rebound HAs.   Final Clinical Impressions(s) / ED Diagnoses   Final diagnoses:  Recurrent headache    ED Discharge Orders         Ordered    butalbital-acetaminophen-caffeine (FIORICET) 50-325-40 MG tablet  Every 6 hours PRN     09/09/18 1435           Jerrell MylarGibbons, Braydn Carneiro J, PA-C 09/09/18 1442    Melene PlanFloyd, Dan, DO 09/09/18 1510

## 2018-09-09 NOTE — ED Notes (Signed)
Pt verbalizes understanding of d/c instructions. Prescriptions reviewed with patient. Pt ambulatory at d/c with all belongings.  

## 2018-09-09 NOTE — ED Notes (Signed)
ED Provider at bedside. 

## 2018-09-09 NOTE — Discharge Instructions (Signed)
You were seen in the ER for headache.  Your symptoms improved after medicines.  Alternate ibuprofen and acetaminophen as needed for headache.  Sometimes overusing these medicines can cause a rebound headache and caused you to have headache when the medicines wear off in your system.  Try to wean yourself off of ibuprofen and acetaminophen over the next 7 to 10 days.  You can use Fioricet for breakthrough headaches as needed.  Return to the ER for severe sudden headache, vision changes or loss, fever, neck pain or stiffness, stroke symptoms.

## 2018-09-09 NOTE — ED Triage Notes (Signed)
Pt reports migraine that started last night. Pt reports recurrent migraines. Reports nausea and some photosensitivity. Pain 8/10. Pt took ibuprofen with some relief.

## 2018-10-04 ENCOUNTER — Encounter (HOSPITAL_COMMUNITY): Payer: Self-pay

## 2018-10-04 ENCOUNTER — Ambulatory Visit (HOSPITAL_COMMUNITY)
Admission: EM | Admit: 2018-10-04 | Discharge: 2018-10-04 | Disposition: A | Discharge status: Home / Self Care | Payer: 59 | Attending: Family Medicine | Admitting: Family Medicine

## 2018-10-04 DIAGNOSIS — K5900 Constipation, unspecified: Secondary | ICD-10-CM | POA: Diagnosis not present

## 2018-10-04 MED ORDER — HYDROCORTISONE ACETATE 25 MG RE SUPP: 25.0000 mg | Freq: Two times a day (BID) | RECTAL | 0 refills | Status: DC

## 2018-10-04 MED ORDER — POLYETHYLENE GLYCOL 3350 17 G PO PACK: 17.0000 g | PACK | Freq: Every day | ORAL | 0 refills | Status: DC

## 2018-10-04 MED ORDER — LIDOCAINE 5 % EX OINT: 1.0000 "application " | TOPICAL_OINTMENT | CUTANEOUS | 0 refills | Status: DC | PRN

## 2018-10-04 NOTE — ED Provider Notes (Signed)
[facility]    CSN: [id number] Arrival date & time: 10/04/18  0945      History   Chief Complaint Chief Complaint  Patient presents with  . Hemorrhoids  . Constipation    HPI Kristen Gomez is a 23 y.o. female.   HPI  Patient has been having intermittent constipation.  She states that after having a few hard bowel movement she now has a bump that is painful near her anus.  She thinks is a hemorrhoid.  She did have a hemorrhoid when she was pregnant.  She states that it does not bother her very often.  She is to have a cream.  She would like a refill of this. We discussed constipation in general.  Water.  Fiber diet.  Avoid laxatives.  Stool softeners daily are safe.  Past Medical History:  Diagnosis Date  . Anxiety   . Headache   . History of cesarean section, low transverse 12/28/2017  . S/P cesarean section 09/30/2014  . UTI (urinary tract infection)     Patient Active Problem List   Diagnosis Date Noted  . Status post repeat low transverse cesarean section 12/31/2017  . History of cesarean section, low transverse 12/28/2017  . S/P cesarean section 09/30/2014  . Labor and delivery, indication for care 09/29/2014    Past Surgical History:  Procedure Laterality Date  . CESAREAN SECTION N/A 09/30/2014   Procedure: CESAREAN SECTION;  Surgeon: Sherian Rein, MD;  Location: [facility];  Service: Obstetrics;  Laterality: N/A;  . CESAREAN SECTION N/A 12/31/2017   Procedure: REPEAT CESAREAN SECTION;  Surgeon: Sherian Rein, MD;  [facility];  Service: Obstetrics;  Laterality: N/A;  [name],  RNFA  . TONSILLECTOMY      OB History    Gravida  2   Para  2   Term  2   Preterm      AB      Living  2     SAB      TAB      Ectopic      Multiple  0   Live Births  2            Home Medications    Prior to Admission medications   Medication Sig Start Date End Date Taking? Authorizing Provider  acetaminophen  (TYLENOL) 500 MG tablet Take 1,000 mg by mouth every 6 (six) hours as needed for headache.    [provider]  butalbital-acetaminophen-caffeine (FIORICET) [phone] MG tablet Take 1-2 tablets by mouth every 6 (six) hours as needed for headache. 09/09/18 09/09/19  Liberty Handy, PA-C  hydrocortisone (ANUSOL-HC) 25 MG suppository Place 1 suppository (25 mg total) rectally 2 (two) times daily. 10/04/18   Eustace Moore, MD  lidocaine (XYLOCAINE) 5 % ointment Apply 1 application topically as needed. 10/04/18   Eustace Moore, MD  norgestimate-ethinyl estradiol (ORTHO-CYCLEN) 0.25-35 MG-MCG tablet Take 1 tablet by mouth daily.    [provider]  polyethylene glycol (MIRALAX) 17 g packet Take 17 g by mouth daily. 10/04/18   Eustace Moore, MD    Family History Family History  Problem Relation Age of Onset  . Healthy Mother   . Healthy Father     Social History Social History   Tobacco Use  . Smoking status: Never Smoker  . Smokeless tobacco: Never Used  Substance Use Topics  . Alcohol use: No  . Drug use: No     Allergies   Patient has  no known allergies.   Review of Systems Review of Systems  Constitutional: Negative for chills and fever.  HENT: Negative for ear pain and sore throat.   Eyes: Negative for pain and visual disturbance.  Respiratory: Negative for cough and shortness of breath.   Cardiovascular: Negative for chest pain and palpitations.  Gastrointestinal: Positive for constipation and rectal pain. Negative for abdominal pain and vomiting.  Genitourinary: Negative for dysuria and hematuria.  Musculoskeletal: Negative for arthralgias and back pain.  Skin: Negative for color change and rash.  Neurological: Negative for seizures and syncope.  All other systems reviewed and are negative.    Physical Exam Triage Vital Signs ED Triage Vitals  Enc Vitals Group     BP 10/04/18 0956 103/74     Pulse Rate 10/04/18 0956 96     Resp  10/04/18 0956 14     Temp 10/04/18 0956 98.2 F (36.8 C)     Temp Source 10/04/18 0956 Oral     SpO2 10/04/18 0956 98 %     Weight --      Height --      Head Circumference --      Peak Flow --      Pain Score 10/04/18 0954 0     Pain Loc --      Pain Edu? --      Excl. in GC? --    No data found.  Updated Vital Signs BP 103/74 (BP Location: Right Arm)   Pulse 96   Temp 98.2 F (36.8 C) (Oral)   Resp 14   SpO2 98%       Physical Exam Constitutional:      General: She is not in acute distress.    Appearance: She is well-developed.  HENT:     Head: Normocephalic and atraumatic.  Eyes:     Conjunctiva/sclera: Conjunctivae normal.     Pupils: Pupils are equal, round, and reactive to light.  Neck:     Musculoskeletal: Normal range of motion.  Cardiovascular:     Rate and Rhythm: Normal rate.  Pulmonary:     Effort: Pulmonary effort is normal. No respiratory distress.  Abdominal:     General: Bowel sounds are normal. There is no distension.     Palpations: Abdomen is soft.     Tenderness: There is no abdominal tenderness.  Musculoskeletal: Normal range of motion.  Skin:    General: Skin is warm and dry.  Neurological:     General: No focal deficit present.     Mental Status: She is alert.  Psychiatric:        Mood and Affect: Mood normal.        Behavior: Behavior normal.      UC Treatments / Results  Labs (all labs ordered are listed, but only abnormal results are displayed) Labs Reviewed - No data to display  EKG   Radiology No results found.  Procedures Procedures (including critical care time)  Medications Ordered in UC Medications - No data to display  Initial Impression / Assessment and Plan / UC Course  I have reviewed the triage vital signs and the nursing notes.  Pertinent labs & imaging results that were available during my care of the patient were reviewed by me and considered in my medical decision making (see chart for details).       Final Clinical Impressions(s) / UC Diagnoses   Final diagnoses:  Constipation, unspecified constipation type     Discharge Instructions  Drink more water Eat a high-fiber diet Take MiraLAX daily.  Use every other day if you bowels are too loose Use wet wipes for cleansing Apply Anusol HC twice a day for hemorrhoid treatment Apply lidocaine gel prior to moving bowels in order to reduce the amount of pain Follow-up with your primary care doctor   ED Prescriptions    Medication Sig Dispense Auth. Provider   hydrocortisone (ANUSOL-HC) 25 MG suppository Place 1 suppository (25 mg total) rectally 2 (two) times daily. 12 suppository Raylene Everts, MD   lidocaine (XYLOCAINE) 5 % ointment Apply 1 application topically as needed. 35.44 g Raylene Everts, MD   polyethylene glycol (MIRALAX) 17 g packet Take 17 g by mouth daily. 14 each Raylene Everts, MD     PDMP not reviewed this encounter.   Raylene Everts, MD 10/04/18 1054

## 2018-10-04 NOTE — Discharge Instructions (Signed)
Drink more water Eat a high-fiber diet Take MiraLAX daily.  Use every other day if you bowels are too loose Use wet wipes for cleansing Apply Anusol HC twice a day for hemorrhoid treatment Apply lidocaine gel prior to moving bowels in order to reduce the amount of pain Follow-up with your primary care doctor

## 2018-10-04 NOTE — ED Triage Notes (Signed)
Patient report she's being constipated for 3 days and yesterday 9/17 she noticed she may have hemorrhoids, is uncomfortable for her to sit down.

## 2019-02-19 ENCOUNTER — Other Ambulatory Visit: Payer: Self-pay

## 2019-02-19 ENCOUNTER — Encounter (HOSPITAL_COMMUNITY): Payer: Self-pay | Admitting: Obstetrics and Gynecology

## 2019-02-19 ENCOUNTER — Inpatient Hospital Stay (HOSPITAL_COMMUNITY)
Admission: AD | Admit: 2019-02-19 | Discharge: 2019-02-20 | Disposition: A | Discharge status: Home / Self Care | Payer: Medicaid Other | Attending: Family Medicine | Admitting: Family Medicine

## 2019-02-19 DIAGNOSIS — Z3A08 8 weeks gestation of pregnancy: Secondary | ICD-10-CM | POA: Insufficient documentation

## 2019-02-19 DIAGNOSIS — Z3A09 9 weeks gestation of pregnancy: Secondary | ICD-10-CM

## 2019-02-19 DIAGNOSIS — E860 Dehydration: Secondary | ICD-10-CM

## 2019-02-19 DIAGNOSIS — O211 Hyperemesis gravidarum with metabolic disturbance: Secondary | ICD-10-CM | POA: Diagnosis not present

## 2019-02-19 DIAGNOSIS — O219 Vomiting of pregnancy, unspecified: Secondary | ICD-10-CM

## 2019-02-19 DIAGNOSIS — O34219 Maternal care for unspecified type scar from previous cesarean delivery: Secondary | ICD-10-CM | POA: Diagnosis not present

## 2019-02-19 DIAGNOSIS — O210 Mild hyperemesis gravidarum: Secondary | ICD-10-CM | POA: Diagnosis present

## 2019-02-19 LAB — I-STAT BETA HCG BLOOD, ED (MC, WL, AP ONLY): I-stat hCG, quantitative: >2000 m[IU]/mL — ABNORMAL HIGH (ref <5)

## 2019-02-19 LAB — COMPREHENSIVE METABOLIC PANEL
ALT: 13 U/L (ref 0–44)
AST: 18 U/L (ref 15–41)
Albumin: 3.7 g/dL (ref 3.5–5.0)
Alkaline Phosphatase: 71 U/L (ref 38–126)
Anion gap: 9 (ref 5–15)
BUN: 7 mg/dL (ref 6–20)
CO2: 23 mmol/L (ref 22–32)
Calcium: 9.4 mg/dL (ref 8.9–10.3)
Chloride: 105 mmol/L (ref 98–111)
Creatinine, Ser: 0.71 mg/dL (ref 0.44–1.00)
GFR calc Af Amer: >60 mL/min (ref >60)
GFR calc non Af Amer: >60 mL/min (ref >60)
Glucose, Bld: 95 mg/dL (ref 70–99)
Potassium: 4 mmol/L (ref 3.5–5.1)
Sodium: 137 mmol/L (ref 135–145)
Total Bilirubin: 0.7 mg/dL (ref 0.3–1.2)
Total Protein: 7.7 g/dL (ref 6.5–8.1)

## 2019-02-19 LAB — LIPASE, BLOOD: Lipase: 26 U/L (ref 11–51)

## 2019-02-19 LAB — CBC
HCT: 40.3 % (ref 36.0–46.0)
Hemoglobin: 13.6 g/dL (ref 12.0–15.0)
MCH: 30.3 pg (ref 26.0–34.0)
MCHC: 33.7 g/dL (ref 30.0–36.0)
MCV: 89.8 fL (ref 80.0–100.0)
Platelets: 263 10*3/uL (ref 150–400)
RBC: 4.49 MIL/uL (ref 3.87–5.11)
RDW: 12.9 % (ref 11.5–15.5)
WBC: 15.5 10*3/uL — ABNORMAL HIGH (ref 4.0–10.5)
nRBC: 0 % (ref 0.0–0.2)

## 2019-02-19 MED ORDER — PROMETHAZINE HCL 25 MG/ML IJ SOLN
12.5000 mg | Freq: Once | INTRAMUSCULAR | Status: AC
  Administered 2019-02-20: 12.5 mg via INTRAVENOUS
  Filled 2019-02-19: qty 1

## 2019-02-19 MED ORDER — SODIUM CHLORIDE 0.9 % IV SOLN: Freq: Once | INTRAVENOUS | Status: AC

## 2019-02-19 MED ORDER — SODIUM CHLORIDE 0.9% FLUSH: 3.0000 mL | Freq: Once | INTRAVENOUS | Status: DC

## 2019-02-19 NOTE — MAU Note (Signed)
Pt states she has been vomiting all day.

## 2019-02-19 NOTE — ED Notes (Signed)
Called Benji @ MAU and gave report.  Called transport to take pt to MAU.

## 2019-02-19 NOTE — ED Provider Notes (Signed)
MSE was initiated and I personally evaluated the patient and placed orders (if any) at  11:17 PM on February 19, 2019.  Nausea and vomiting.  She is pregnant.  Reports taking a pregnancy test last week, which was positive.  LMP 12/23/2018.  Denies any abdominal pain.  Denies any vaginal bleeding.  Complains only of nausea and vomiting today.  She does not have any focal abdominal tenderness on my exam.  I discussed the case with Mayford Knife, CNM, who will accept the patient in transfer to the MAU and continue care.     The patient appears stable so that the remainder of the MSE may be completed by another provider.   Roxy Horseman, PA-C 02/20/19 9906    Little, Ambrose Finland, MD 02/20/19 1500

## 2019-02-19 NOTE — ED Notes (Signed)
Per Rob, PA pt Ok to send to MAU.

## 2019-02-19 NOTE — MAU Provider Note (Signed)
Chief Complaint: Emesis   First Provider Initiated Contact with Patient 02/19/19 2351        SUBJECTIVE HPI: Kristen Gomez is a 24 y.o. G3P2002 at [redacted]w[redacted]d by LMP who presents to maternity admissions reporting nausea and vomiting all week.  Has not taken anything for it.  Used Zofran in prior pregnancy. . She denies vaginal bleeding, vaginal itching/burning, urinary symptoms, h/a, dizziness, n/v, or fever/chills.    Emesis  This is a new problem. The current episode started in the past 7 days. The problem occurs 2 to 4 times per day. The problem has been unchanged. There has been no fever. Pertinent negatives include no abdominal pain, chest pain, chills, diarrhea, dizziness, fever, headaches or sweats. She has tried nothing for the symptoms.   ED Note: Nausea and vomiting.  She is pregnant.  Reports taking a pregnancy test last week, which was positive.  LMP 12/23/2018.  Denies any abdominal pain.  Denies any vaginal bleeding.  Complains only of nausea and vomiting today.  Past Medical History:  Diagnosis Date  . Anxiety   . Headache   . History of cesarean section, low transverse 12/28/2017  . S/P cesarean section 09/30/2014  . UTI (urinary tract infection)    Past Surgical History:  Procedure Laterality Date  . CESAREAN SECTION N/A 09/30/2014   Procedure: CESAREAN SECTION;  Surgeon: Janyth Contes, MD;  Location: [facility];  Service: Obstetrics;  Laterality: N/A;  . CESAREAN SECTION N/A 12/31/2017   Procedure: REPEAT CESAREAN SECTION;  Surgeon: Janyth Contes, MD;  Location: [facility];  Service: Obstetrics;  Laterality: N/A;  [name],  RNFA  . TONSILLECTOMY     Social History   Socioeconomic History  . Marital status: Single    Spouse name: Not on file  . Number of children: Not on file  . Years of education: Not on file  . Highest education level: Not on file  Occupational History  . Not on file  Tobacco Use  . Smoking status: Never Smoker  . Smokeless  tobacco: Never Used  Substance and Sexual Activity  . Alcohol use: No  . Drug use: No  . Sexual activity: Yes    Birth control/protection: None  Other Topics Concern  . Not on file  Social History Narrative  . Not on file   Social Determinants of Health   Financial Resource Strain:   . Difficulty of Paying Living Expenses: Not on file  Food Insecurity:   . Worried About Charity fundraiser in the Last Year: Not on file  . Ran Out of Food in the Last Year: Not on file  Transportation Needs:   . Lack of Transportation (Medical): Not on file  . Lack of Transportation (Non-Medical): Not on file  Physical Activity:   . Days of Exercise per Week: Not on file  . Minutes of Exercise per Session: Not on file  Stress:   . Feeling of Stress : Not on file  Social Connections:   . Frequency of Communication with Friends and Family: Not on file  . Frequency of Social Gatherings with Friends and Family: Not on file  . Attends Religious Services: Not on file  . Active Member of Clubs or Organizations: Not on file  . Attends Archivist Meetings: Not on file  . Marital Status: Not on file  Intimate Partner Violence:   . Fear of Current or Ex-Partner: Not on file  . Emotionally Abused: Not on file  . Physically Abused: Not  on file  . Sexually Abused: Not on file   No current facility-administered medications on file prior to encounter.   Current Outpatient Medications on File Prior to Encounter  Medication Sig Dispense Refill  . acetaminophen (TYLENOL) 500 MG tablet Take 1,000 mg by mouth every 6 (six) hours as needed for headache.    . butalbital-acetaminophen-caffeine (FIORICET) 50-325-40 MG tablet Take 1-2 tablets by mouth every 6 (six) hours as needed for headache. 20 tablet 0  . hydrocortisone (ANUSOL-HC) 25 MG suppository Place 1 suppository (25 mg total) rectally 2 (two) times daily. 12 suppository 0  . lidocaine (XYLOCAINE) 5 % ointment Apply 1 application topically as  needed. 35.44 g 0  . norgestimate-ethinyl estradiol (ORTHO-CYCLEN) 0.25-35 MG-MCG tablet Take 1 tablet by mouth daily.    . polyethylene glycol (MIRALAX) 17 g packet Take 17 g by mouth daily. 14 each 0   No Known Allergies  I have reviewed patient's Past Medical Hx, Surgical Hx, Family Hx, Social Hx, medications and allergies.   ROS:  Review of Systems  Constitutional: Negative for chills and fever.  Respiratory: Negative for shortness of breath.   Cardiovascular: Negative for chest pain.  Gastrointestinal: Positive for vomiting. Negative for abdominal pain and diarrhea.  Neurological: Negative for dizziness, weakness and headaches.   Review of Systems  Other systems negative   Physical Exam  Physical Exam Patient Vitals for the past 24 hrs:  BP Temp Temp src Pulse Resp SpO2 Weight  02/19/19 2346 106/70 98 F (36.7 C) -- 99 12 98 % 73.3 kg  02/19/19 2214 109/73 98 F (36.7 C) Oral (!) 110 18 99 % --   Constitutional: Well-developed, well-nourished female in no acute distress.  Cardiovascular: normal rate Respiratory: normal effort GI: Abd soft, non-tender. Pos BS x 4 MS: Extremities nontender, no edema, normal ROM Neurologic: Alert and oriented x 4.  GU: Neg CVAT.  PELVIC EXAM: deferred  FHT 160 by bedside US.    LAB RESULTS Results for orders placed or performed during the hospital encounter of 02/19/19 (from the past 24 hour(s))  Lipase, blood     Status: None   Collection Time: 02/19/19 10:27 PM  Result Value Ref Range   Lipase 26 11 - 51 U/L  Comprehensive metabolic panel     Status: None   Collection Time: 02/19/19 10:27 PM  Result Value Ref Range   Sodium 137 135 - 145 mmol/L   Potassium 4.0 3.5 - 5.1 mmol/L   Chloride 105 98 - 111 mmol/L   CO2 23 22 - 32 mmol/L   Glucose, Bld 95 70 - 99 mg/dL   BUN 7 6 - 20 mg/dL   Creatinine, Ser 3.01 0.44 - 1.00 mg/dL   Calcium 9.4 8.9 - 60.1 mg/dL   Total Protein 7.7 6.5 - 8.1 g/dL   Albumin 3.7 3.5 - 5.0 g/dL    AST 18 15 - 41 U/L   ALT 13 0 - 44 U/L   Alkaline Phosphatase 71 38 - 126 U/L   Total Bilirubin 0.7 0.3 - 1.2 mg/dL   GFR calc non Af Amer >60 >60 mL/min   GFR calc Af Amer >60 >60 mL/min   Anion gap 9 5 - 15  CBC     Status: Abnormal   Collection Time: 02/19/19 10:27 PM  Result Value Ref Range   WBC 15.5 (H) 4.0 - 10.5 K/uL   RBC 4.49 3.87 - 5.11 MIL/uL   Hemoglobin 13.6 12.0 - 15.0 g/dL   HCT  40.3 36.0 - 46.0 %   MCV 89.8 80.0 - 100.0 fL   MCH 30.3 26.0 - 34.0 pg   MCHC 33.7 30.0 - 36.0 g/dL   RDW 78.4 12.8 - 20.8 %   Platelets 263 150 - 400 K/uL   nRBC 0.0 0.0 - 0.2 %  I-Stat beta hCG blood, ED     Status: Abnormal   Collection Time: 02/19/19 10:43 PM  Result Value Ref Range   I-stat hCG, quantitative >2,000.0 (H) <5 mIU/mL   Comment 3               IMAGING No results found.  MAU Management/MDM: ED had already Ordered labs.  Sodium and potassium are normal  Lipase is normal     Treatments in MAU included IV Hydration and antiemetic (promethazine).. She felt better afterward and was able to keep down crackers and juice.    ASSESSMENT Single intrauterine pregnancy at [redacted]w[redacted]d Nausea and vomiting Mild dehydration  PLAN Discharge home Rx Diclegis for nausea at home.  Discussed if it does not work there are other meds we can use Increase diet as tolerated Pt stable at time of discharge. Encouraged to return here or to other Urgent Care/ED if she develops worsening of symptoms, increase in pain, fever, or other concerning symptoms.    Kristen Gomez CNM, MSN Certified Nurse-Midwife 02/19/2019  11:52 PM

## 2019-02-19 NOTE — ED Triage Notes (Signed)
Onset several weeks nausea, vomited x 8 today.  Pt thinks she might be pregnant.   Pt took a home pregnancy test and was positive.

## 2019-02-20 LAB — URINALYSIS, ROUTINE W REFLEX MICROSCOPIC
Bilirubin Urine: NEGATIVE
Glucose, UA: NEGATIVE mg/dL
Hgb urine dipstick: NEGATIVE
Ketones, ur: 20 mg/dL — AB
Leukocytes,Ua: NEGATIVE
Nitrite: NEGATIVE
Protein, ur: NEGATIVE mg/dL
Specific Gravity, Urine: 1.032 — ABNORMAL HIGH (ref 1.005–1.030)
pH: 5 (ref 5.0–8.0)

## 2019-02-20 MED ORDER — DOXYLAMINE-PYRIDOXINE 10-10 MG PO TBEC: 2.0000 | DELAYED_RELEASE_TABLET | Freq: Every evening | ORAL | 2 refills | Status: DC | PRN

## 2019-02-20 MED ORDER — SODIUM CHLORIDE 0.9 % IV SOLN: Freq: Once | INTRAVENOUS | Status: AC

## 2019-02-20 NOTE — MAU Note (Signed)
Patient signed paper AVS.

## 2019-02-20 NOTE — Discharge Instructions (Signed)

## 2019-04-02 LAB — OB RESULTS CONSOLE HEPATITIS B SURFACE ANTIGEN: Hepatitis B Surface Ag: NEGATIVE

## 2019-04-02 LAB — HEPATITIS C ANTIBODY: HCV Ab: NEGATIVE

## 2019-04-02 LAB — OB RESULTS CONSOLE RUBELLA ANTIBODY, IGM: Rubella: NON-IMMUNE/NOT IMMUNE

## 2019-04-02 LAB — OB RESULTS CONSOLE HIV ANTIBODY (ROUTINE TESTING): HIV: NONREACTIVE

## 2019-05-17 ENCOUNTER — Other Ambulatory Visit: Payer: Self-pay

## 2019-05-17 ENCOUNTER — Inpatient Hospital Stay (HOSPITAL_COMMUNITY)
Admission: AD | Admit: 2019-05-17 | Discharge: 2019-05-17 | Disposition: A | Discharge status: Home / Self Care | Payer: Medicaid Other | Source: Ambulatory Visit | Attending: Obstetrics and Gynecology | Admitting: Obstetrics and Gynecology

## 2019-05-17 ENCOUNTER — Encounter (HOSPITAL_COMMUNITY): Payer: Self-pay | Admitting: Obstetrics and Gynecology

## 2019-05-17 ENCOUNTER — Ambulatory Visit (HOSPITAL_COMMUNITY)
Admission: EM | Admit: 2019-05-17 | Discharge: 2019-05-17 | Disposition: A | Discharge status: Home / Self Care | Payer: Medicaid Other

## 2019-05-17 DIAGNOSIS — Z3A20 20 weeks gestation of pregnancy: Secondary | ICD-10-CM | POA: Insufficient documentation

## 2019-05-17 DIAGNOSIS — M533 Sacrococcygeal disorders, not elsewhere classified: Secondary | ICD-10-CM | POA: Diagnosis not present

## 2019-05-17 DIAGNOSIS — M549 Dorsalgia, unspecified: Secondary | ICD-10-CM | POA: Diagnosis not present

## 2019-05-17 DIAGNOSIS — R102 Pelvic and perineal pain: Secondary | ICD-10-CM | POA: Diagnosis not present

## 2019-05-17 DIAGNOSIS — O26892 Other specified pregnancy related conditions, second trimester: Secondary | ICD-10-CM | POA: Insufficient documentation

## 2019-05-17 DIAGNOSIS — Z79899 Other long term (current) drug therapy: Secondary | ICD-10-CM | POA: Insufficient documentation

## 2019-05-17 LAB — URINALYSIS, ROUTINE W REFLEX MICROSCOPIC
Bilirubin Urine: NEGATIVE
Glucose, UA: NEGATIVE mg/dL
Hgb urine dipstick: NEGATIVE
Ketones, ur: NEGATIVE mg/dL
Leukocytes,Ua: NEGATIVE
Nitrite: NEGATIVE
Protein, ur: NEGATIVE mg/dL
Specific Gravity, Urine: 1.021 (ref 1.005–1.030)
pH: 6 (ref 5.0–8.0)

## 2019-05-17 MED ORDER — KETOROLAC TROMETHAMINE 60 MG/2ML IM SOLN
60.0000 mg | Freq: Once | INTRAMUSCULAR | Status: AC
  Administered 2019-05-17: 20:00:00 60 mg via INTRAMUSCULAR
  Filled 2019-05-17: qty 2

## 2019-05-17 NOTE — MAU Note (Signed)
Kristen Gomez is a 24 y.o. at [redacted]w[redacted]d here in MAU reporting:  Lower back/pelvic pain. Endorses that it is hard to get comfortable, walk, or move.  Endorses that she called the on call on Thursday as well as had an evaluation for this pain where she was told that she probably fractured her tailbone from a fall a couple weeks ago. Called the on call back today regarding how much tylenol she could take because it was not helping the pain. Last dose of tylenol was about 2 hours ago.  States she went to urgent care today per the on call whom sent her here. Pain score: 9/10 Vitals:   05/17/19 1815  BP: (!) 96/57  Pulse: 99  Resp: 16  Temp: 98 F (36.7 C)  SpO2: 99%    FHT: +FM;146 via doppler Lab orders placed from triage: ua

## 2019-05-17 NOTE — ED Notes (Signed)
Eval pt in waiting room. Pt reports that she fell a couple weeks ago and struck her tailbone/lower back area. C/o recurring pain to lower sacral area. Denies abdom pain, vag bleeding or other gyn/OB complaint.  Pt states she was evaluated on Thursday by her OB/BYN doctor for minor bleeding that pt attributes to "scratching" her vaginal area. No bleeding since, and pt states that her OB doctor stated fetal health WNL.

## 2019-05-17 NOTE — ED Notes (Signed)
Jerrilyn Cairo, NP states pt should go to [facility] for eval 2/2 OB medical history and ongoing back pain. Pt verbalized understanding.

## 2019-05-17 NOTE — MAU Provider Note (Addendum)
Patient Kristen Gomez 24 y.o. [id number]  at [redacted]w[redacted]d here with complaints of tailbone pain. She reports that she fell and hit her tailbone two weeks ago; since then she has felt this pain.   She denies vaginal bleeding, vaginal discharge, contractions. She  Has had an anatomy scan a week ago; per patient, everything was normal (other than low-lying placenta).    History     CSN: [id number]  Arrival date and time: 05/17/19 1747   First Provider Initiated Contact with Patient 05/17/19 1930      Chief Complaint  Patient presents with  . Pelvic Pain   Back Pain This is a new problem. The current episode started 1 to 4 weeks ago. The problem occurs constantly. The pain is present in the sacro-iliac. The pain is at a severity of 10/10. The pain is the same all the time. Associated symptoms include pelvic pain. Pertinent negatives include no chest pain, dysuria or fever.    OB History    Gravida  3   Para  2   Term  2   Preterm      AB      Living  2     SAB      TAB      Ectopic      Multiple  0   Live Births  2           Past Medical History:  Diagnosis Date  . Anxiety   . Headache   . History of cesarean section, low transverse 12/28/2017  . S/P cesarean section 09/30/2014  . UTI (urinary tract infection)     Past Surgical History:  Procedure Laterality Date  . CESAREAN SECTION N/A 09/30/2014   Procedure: CESAREAN SECTION;  Surgeon: Janyth Contes, MD;  Location: [facility];  Service: Obstetrics;  Laterality: N/A;  . CESAREAN SECTION N/A 12/31/2017   Procedure: REPEAT CESAREAN SECTION;  Surgeon: Janyth Contes, MD;  Location: [facility];  Service: Obstetrics;  Laterality: N/A;  [name],  RNFA  . TONSILLECTOMY      Family History  Problem Relation Age of Onset  . Healthy Mother   . Healthy Father     Social History   Tobacco Use  . Smoking status: Never Smoker  . Smokeless tobacco: Never Used  Substance Use Topics  . Alcohol use:  No  . Drug use: No    Allergies: No Known Allergies  Medications Prior to Admission  Medication Sig Dispense Refill Last Dose  . acetaminophen (TYLENOL) 500 MG tablet Take 1,000 mg by mouth every 6 (six) hours as needed for headache.     . butalbital-acetaminophen-caffeine (FIORICET) 50-325-40 MG tablet Take 1-2 tablets by mouth every 6 (six) hours as needed for headache. 20 tablet 0   . Doxylamine-Pyridoxine (DICLEGIS) 10-10 MG TBEC Take 2 tablets by mouth at bedtime as needed (nausea). 60 tablet 2   . hydrocortisone (ANUSOL-HC) 25 MG suppository Place 1 suppository (25 mg total) rectally 2 (two) times daily. 12 suppository 0   . lidocaine (XYLOCAINE) 5 % ointment Apply 1 application topically as needed. 35.44 g 0   . polyethylene glycol (MIRALAX) 17 g packet Take 17 g by mouth daily. 14 each 0     Review of Systems  Constitutional: Negative for fever.  Cardiovascular: Negative for chest pain.  Genitourinary: Positive for pelvic pain. Negative for dysuria.  Musculoskeletal: Positive for back pain.   Physical Exam   Blood pressure (!) 96/57, pulse 99, temperature 98 F (36.7  C), temperature source Oral, resp. rate 16, last menstrual period 12/23/2018, SpO2 99 %, not currently breastfeeding.  Physical Exam  Constitutional: She is oriented to person, place, and time. She appears well-developed.  HENT:  Head: Normocephalic.  Eyes: Pupils are equal, round, and reactive to light.  Respiratory: Effort normal.  GI: Soft.  Musculoskeletal:        General: Tenderness present. Normal range of motion.     Cervical back: Normal range of motion.     Comments: Tenderness over sacrum, no bruising or swelling.   Neurological: She is alert and oriented to person, place, and time.    MAU Course  Procedures  MDM FHR is 146 bpm Patient desiring pain medicine for pain, will try a shot of Toradol. Reviewed with patient tailbone injuries can take many weeks to heal.   Patient care endorsed  to oncoming CNM at 2005.  Assessment and Plan     Charlesetta Garibaldi [facility] 05/17/2019, 7:36 PM   Reassessment (8:34 PM)  Patient reports improvement in pain. Okay for discharge. Nurse to give precautions Okay for tylenol dosing Patient to call primary ob or return to MAU for worsening or onset of new symptoms. Discharged to home in improved condition.  Cherre Robins MSN, CNM Advanced Practice Provider, [facility] [employer]

## 2019-05-17 NOTE — Discharge Instructions (Signed)

## 2019-06-02 ENCOUNTER — Other Ambulatory Visit: Payer: Self-pay

## 2019-06-02 ENCOUNTER — Inpatient Hospital Stay (HOSPITAL_COMMUNITY)
Admission: AD | Admit: 2019-06-02 | Discharge: 2019-06-02 | Disposition: A | Discharge status: Home / Self Care | Payer: Medicaid Other | Attending: Obstetrics and Gynecology | Admitting: Obstetrics and Gynecology

## 2019-06-02 DIAGNOSIS — O99612 Diseases of the digestive system complicating pregnancy, second trimester: Secondary | ICD-10-CM | POA: Diagnosis not present

## 2019-06-02 DIAGNOSIS — R21 Rash and other nonspecific skin eruption: Secondary | ICD-10-CM | POA: Insufficient documentation

## 2019-06-02 DIAGNOSIS — K130 Diseases of lips: Secondary | ICD-10-CM | POA: Diagnosis not present

## 2019-06-02 DIAGNOSIS — O26892 Other specified pregnancy related conditions, second trimester: Secondary | ICD-10-CM | POA: Insufficient documentation

## 2019-06-02 DIAGNOSIS — Z3A23 23 weeks gestation of pregnancy: Secondary | ICD-10-CM | POA: Insufficient documentation

## 2019-06-02 MED ORDER — VALACYCLOVIR HCL 1 G PO TABS: 1000.0000 mg | ORAL_TABLET | Freq: Two times a day (BID) | ORAL | 3 refills | Status: DC

## 2019-06-02 NOTE — MAU Note (Signed)
Kristen Gomez is a 24 y.o. at [redacted]w[redacted]d here in MAU reporting: on Saturday started breaking out on her lips. Took some benadryl that night, didn't help. Ice helps but then it comes right back. Also itches.   Onset of complaint: Saturday  Pain score: 0/10  Vitals:   06/02/19 1217  BP: (!) 96/58  Pulse: (!) 101  Resp: 16  Temp: 98.5 F (36.9 C)  SpO2: 99%     FHT: 154  Lab orders placed from triage: none

## 2019-06-02 NOTE — MAU Provider Note (Signed)
History     CSN: [id number]  Arrival date and time: 06/02/19 1132   First Provider Initiated Contact with Patient 06/02/19 1234      Chief Complaint  Patient presents with  . Pruritis   HPI Kristen Gomez is a 24 y.o. G3P2002 at [redacted]w[redacted]d who presents to MAU with chief complaint of rash on her lips. This is a new problem, onset Saturday 05/31/2019. She reports a flat round rash on the border of her upper and lower lips. One or two lesions were raised and "seemed like they had pus in them" at onset. She endorsed itching on sign-in to MAU but denies itching on CNM initial assessment. She denies history of HSV but is unsure of partner STI testing. She states she spoke with her OB Provider and was advised to continue Benadryl and ice PRN.  She denies any OB concerns including abdominal pain, vaginal bleeding, dysuria, fever or illness.  She receives care with [facility] and her next appointment is Friday 06/06/2019.  OB History    Gravida  3   Para  2   Term  2   Preterm      AB      Living  2     SAB      TAB      Ectopic      Multiple  0   Live Births  2           Past Medical History:  Diagnosis Date  . Anxiety   . Headache   . History of cesarean section, low transverse 12/28/2017  . S/P cesarean section 09/30/2014  . UTI (urinary tract infection)     Past Surgical History:  Procedure Laterality Date  . CESAREAN SECTION N/A 09/30/2014   Procedure: CESAREAN SECTION;  Surgeon: Sherian Rein, MD;  Location: [facility];  Service: Obstetrics;  Laterality: N/A;  . CESAREAN SECTION N/A 12/31/2017   Procedure: REPEAT CESAREAN SECTION;  Surgeon: Sherian Rein, MD;  Location: [facility];  Service: Obstetrics;  Laterality: N/A;  [name],  RNFA  . TONSILLECTOMY      Family History  Problem Relation Age of Onset  . Healthy Mother   . Healthy Father     Social History   Tobacco Use  . Smoking status: Never Smoker  . Smokeless tobacco:  Never Used  Substance Use Topics  . Alcohol use: No  . Drug use: No    Allergies: No Known Allergies  Medications Prior to Admission  Medication Sig Dispense Refill Last Dose  . acetaminophen (TYLENOL) 500 MG tablet Take 1,000 mg by mouth every 6 (six) hours as needed for headache.     . butalbital-acetaminophen-caffeine (FIORICET) 50-325-40 MG tablet Take 1-2 tablets by mouth every 6 (six) hours as needed for headache. 20 tablet 0   . Doxylamine-Pyridoxine (DICLEGIS) 10-10 MG TBEC Take 2 tablets by mouth at bedtime as needed (nausea). 60 tablet 2   . hydrocortisone (ANUSOL-HC) 25 MG suppository Place 1 suppository (25 mg total) rectally 2 (two) times daily. 12 suppository 0   . lidocaine (XYLOCAINE) 5 % ointment Apply 1 application topically as needed. 35.44 g 0   . polyethylene glycol (MIRALAX) 17 g packet Take 17 g by mouth daily. 14 each 0     Review of Systems  Gastrointestinal: Negative for abdominal pain.  Genitourinary: Negative for vaginal bleeding.  Musculoskeletal: Negative for back pain.  Skin: Positive for rash.  All other systems reviewed and are negative.  Physical Exam  Blood pressure (!) 96/58, pulse (!) 101, temperature 98.5 F (36.9 C), temperature source Oral, resp. rate 16, height 4\' 9"  (1.448 m), weight 85.2 kg, last menstrual period 12/23/2018, SpO2 99 %, not currently breastfeeding.  Physical Exam  Nursing note and vitals reviewed. Constitutional: She appears well-developed and well-nourished.  Cardiovascular: Normal rate.  Respiratory: Effort normal and breath sounds normal.  Skin: Rash noted.    MAU Course/MDM  Procedures    Orders Placed This Encounter  Procedures  . Hsv Culture And Typing  . Discharge patient   Meds ordered this encounter  Medications  . valACYclovir (VALTREX) 1000 MG tablet    Sig: Take 1 tablet (1,000 mg total) by mouth 2 (two) times daily. Take for ten days.    Dispense:  20 tablet    Refill:  3    Order Specific  Question:   Supervising Provider    Answer:   Aletha Halim [[id number]]   --Discussed with Dr. Barrington Ellison. HSV swab collected and presumptive treatment for first outbreak prescribed  Patient Vitals for the past 24 hrs:  BP Temp Temp src Pulse Resp SpO2 Height Weight  06/02/19 1217 (!) 96/58 98.5 F (36.9 C) Oral (!) 101 16 99 % -- --  06/02/19 1212 -- -- -- -- -- -- 4\' 9"  (1.448 m) 85.2 kg   Assessment and Plan  --24 y.o. 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

## 2019-06-02 NOTE — Discharge Instructions (Signed)
Cold Sore  A cold sore, also called a fever blister, is a small, fluid-filled sore that forms inside the mouth or on the lips, gums, nose, chin, or cheeks. Cold sores can spread to other parts of the body, such as the eyes or fingers. In some people who have other medical conditions, cold sores can spread to multiple other body sites, including the genitals. Cold sores can spread from person to person (are contagious) until the sores crust over completely. Most cold sores go away within 2 weeks. What are the causes? Cold sores are caused by an infection from a common type of herpes simplex virus (HSV-1). HSV-1 is closely related to the HSV-2virus, which is the virus that causes genital herpes, but these viruses are not the same. Once a person is infected with HSV-1, the virus remains permanently in the body. HSV-1 is spread from person to person through close contact, such as through kissing, touching the affected area, or sharing personal items such as lip balm, razors, a drinking glass, or eating utensils. What increases the risk? You are more likely to develop this condition if you:  Are tired, stressed, or sick.  Are menstruating.  Are pregnant.  Take certain medicines.  Are exposed to cold weather or too much sun. What are the signs or symptoms? Symptoms of a cold sore outbreak go through different stages. These are the stages of a cold sore:  Tingling, itching, or burning is felt 1-2 days before the outbreak.  Fluid-filled blisters appear on the lips, inside the mouth, on the nose, or on the cheeks.  The blisters start to ooze clear fluid.  The blisters dry up, and a yellow crust appears in their place.  The crust falls off. In some cases, other symptoms can develop during a cold sore outbreak. These can include:  Fever.  Sore throat.  Headache.  Muscle aches.  Swollen neck glands. How is this diagnosed? This condition is diagnosed based on your medical history and a  physical exam. Your health care provider may do a blood test or may swab some fluid from your sore and then examine the swab in the lab. How is this treated? There is no cure for cold sores or HSV-1. There is also no vaccine for HSV-1. Most cold sores go away on their own without treatment within 2 weeks. Medicines cannot make the infection go away, but your health care provider may prescribe medicines to:  Help relieve some of the pain associated with the sores.  Work to stop the virus from multiplying.  Shorten healing time. Medicines may be in the form of creams, gels, pills, or a shot. Follow these instructions at home: Medicines  Take or apply over-the-counter and prescription medicines only as told by your health care provider.  Use a cotton-tip swab to apply creams or gels to your sores.  Ask your health care provider if you can take lysine supplements. Research has found that lysine may help heal the cold sore faster and prevent outbreaks. Sore care   Do not touch the sores or pick the scabs.  Wash your hands often. Do not touch your eyes without washing your hands first.  Keep the sores clean and dry.  If directed, apply ice to the sores: ? Put ice in a plastic bag. ? Place a towel between your skin and the bag. ? Leave the ice on for 20 minutes, 2-3 times a day. Eating and drinking  Eat a soft, bland diet. Avoid eating   hot, cold, or salty foods.  Use a straw if it hurts to drink out of a glass.  Eat foods that are rich in lysine, such as meat, fish, and dairy products.  Avoid sugary foods, chocolates, nuts, and grains. These foods are rich in a nutrient called arginine, which can cause the virus to multiply. Lifestyle  Do not kiss, have oral sex, or share personal items until your sores heal.  Stress, poor sleep, and being out in the sun can trigger outbreaks. Make sure you: ? Do activities that help you relax, such as deep breathing exercises or  meditation. ? Get enough sleep. ? Apply sunscreen on your lips before you go out in the sun. Contact a health care provider if:  You have symptoms for more than 2 weeks.  You have pus coming from the sores.  You have redness that is spreading.  You have pain or irritation in your eye.  You get sores on your genitals.  Your sores do not heal within 2 weeks.  You have frequent cold sore outbreaks. Get help right away if you have:  A fever and your symptoms suddenly get worse.  A headache and confusion.  Fatigue or loss of appetite.  A stiff neck or sensitivity to light. Summary  A cold sore, also called a fever blister, is a small, fluid-filled sore that forms inside the mouth or on the lips, gums, nose, chin, or cheeks.  Most cold sores go away on their own without treatment within 2 weeks. Your health care provider may prescribe medicines to help relieve some of the pain, work to stop the virus from multiplying, and shorten healing time.  Wash your hands often. Do not touch your eyes without washing your hands first.  Do not kiss, have oral sex, or share personal items until your sores heal.  Contact a health care provider if your sores do not heal within 2 weeks. This information is not intended to replace advice given to you by your health care provider. Make sure you discuss any questions you have with your health care provider. Document Revised: 04/24/2018 Document Reviewed: 06/04/2017 Elsevier Patient Education  2020 [employer]   

## 2019-06-04 LAB — HSV CULTURE AND TYPING

## 2019-07-07 LAB — OB RESULTS CONSOLE HIV ANTIBODY (ROUTINE TESTING): HIV: NONREACTIVE

## 2019-09-02 LAB — OB RESULTS CONSOLE GBS: GBS: POSITIVE

## 2019-09-12 ENCOUNTER — Inpatient Hospital Stay (HOSPITAL_COMMUNITY)
Admission: AD | Admit: 2019-09-12 | Discharge: 2019-09-12 | Disposition: A | Discharge status: Home / Self Care | Payer: Medicaid Other | Attending: Obstetrics and Gynecology | Admitting: Obstetrics and Gynecology

## 2019-09-12 ENCOUNTER — Encounter (HOSPITAL_COMMUNITY): Payer: Self-pay | Admitting: Obstetrics and Gynecology

## 2019-09-12 ENCOUNTER — Other Ambulatory Visit: Payer: Self-pay

## 2019-09-12 DIAGNOSIS — F419 Anxiety disorder, unspecified: Secondary | ICD-10-CM | POA: Insufficient documentation

## 2019-09-12 DIAGNOSIS — O99343 Other mental disorders complicating pregnancy, third trimester: Secondary | ICD-10-CM | POA: Diagnosis not present

## 2019-09-12 DIAGNOSIS — Z3A37 37 weeks gestation of pregnancy: Secondary | ICD-10-CM | POA: Insufficient documentation

## 2019-09-12 DIAGNOSIS — U071 COVID-19: Secondary | ICD-10-CM | POA: Insufficient documentation

## 2019-09-12 DIAGNOSIS — O98513 Other viral diseases complicating pregnancy, third trimester: Secondary | ICD-10-CM | POA: Diagnosis not present

## 2019-09-12 DIAGNOSIS — J069 Acute upper respiratory infection, unspecified: Secondary | ICD-10-CM

## 2019-09-12 LAB — URINALYSIS, ROUTINE W REFLEX MICROSCOPIC
Bilirubin Urine: NEGATIVE
Glucose, UA: NEGATIVE mg/dL
Hgb urine dipstick: NEGATIVE
Ketones, ur: 20 mg/dL — AB
Nitrite: NEGATIVE
Protein, ur: NEGATIVE mg/dL
Specific Gravity, Urine: 1.015 (ref 1.005–1.030)
Squamous Epithelial / HPF: >50 — ABNORMAL HIGH (ref 0–5)
pH: 6 (ref 5.0–8.0)

## 2019-09-12 LAB — SARS CORONAVIRUS 2 BY RT PCR (HOSPITAL ORDER, PERFORMED IN ~~LOC~~ HOSPITAL LAB): SARS Coronavirus 2: POSITIVE — AB

## 2019-09-12 NOTE — MAU Note (Signed)
Pt presents to MAU with c/o of body aches and fatigue that started x 2 days ago. Pt has mild nasal congestion x 1 day. Pt denies fever, cough/shortness of breath and loss of taste/smell. Pt denies VB and LOF. +FM

## 2019-09-12 NOTE — MAU Provider Note (Signed)
Chief Complaint:  Generalized Body Aches and Fatigue   First Provider Initiated Contact with Patient 09/12/19 0912      HPI: Kristen Gomez is a 24 y.o. G3P2002 at [redacted]w[redacted]d by LMP who presents to maternity admissions reporting congestion, body aches, chills without fever, and fatigue x 3 days. She has no sick contacts, no known exposure to COVID 19. She denies any obstetric complaints, is feeling fetal movement and denies regular cramping/contractions.  She denies any shortness of breath or chest pain.   Location: generalized Quality: aching Severity: 5/10 on pain scale Duration: 2 days Timing: intermittent Modifying factors: none Associated signs and symptoms: congestion, chills without fever  HPI  Past Medical History: Past Medical History:  Diagnosis Date  . Anxiety   . Headache   . History of cesarean section, low transverse 12/28/2017  . S/P cesarean section 09/30/2014  . UTI (urinary tract infection)     Past obstetric history: OB History  Gravida Para Term Preterm AB Living  3 2 2     2   SAB TAB Ectopic Multiple Live Births        0 2    # Outcome Date GA Lbr Len/2nd Weight Sex Delivery Anes PTL Lv  3 Current           2 Term 12/31/17 [redacted]w[redacted]d  3210 g F CS-LTranv Spinal  LIV  1 Term 09/30/14 [redacted]w[redacted]d  3650 g F CS-LVertical Spinal  LIV    Past Surgical History: Past Surgical History:  Procedure Laterality Date  . CESAREAN SECTION N/A 09/30/2014   Procedure: CESAREAN SECTION;  Surgeon: 10/02/2014, MD;  Location: [facility];  Service: Obstetrics;  Laterality: N/A;  . CESAREAN SECTION N/A 12/31/2017   Procedure: REPEAT CESAREAN SECTION;  Surgeon: 01/02/2018, MD;  Location: [facility];  Service: Obstetrics;  Laterality: N/A;  [name],  RNFA  . TONSILLECTOMY      Family History: Family History  Problem Relation Age of Onset  . Healthy Mother   . Healthy Father     Social History: Social History   Tobacco Use  . Smoking status: Never Smoker   . Smokeless tobacco: Never Used  Vaping Use  . Vaping Use: Never used  Substance Use Topics  . Alcohol use: No  . Drug use: No    Allergies: No Known Allergies  Meds:  Medications Prior to Admission  Medication Sig Dispense Refill Last Dose  . Prenatal Vit-Fe Fumarate-FA (MULTIVITAMIN-PRENATAL) 27-0.8 MG TABS tablet Take 1 tablet by mouth daily at 12 noon.   09/11/2019 at Unknown time  . valACYclovir (VALTREX) 1000 MG tablet Take 1 tablet (1,000 mg total) by mouth 2 (two) times daily. Take for ten days. 20 tablet 3     ROS:  Review of Systems  Constitutional: Positive for chills and fatigue. Negative for fever.  HENT: Positive for congestion. Negative for sore throat.   Eyes: Negative for visual disturbance.  Respiratory: Negative for cough and shortness of breath.   Cardiovascular: Negative for chest pain.  Gastrointestinal: Negative for abdominal pain, nausea and vomiting.  Genitourinary: Negative for difficulty urinating, dysuria, flank pain, pelvic pain, vaginal bleeding, vaginal discharge and vaginal pain.  Neurological: Negative for dizziness and headaches.  Psychiatric/Behavioral: Negative.      I have reviewed patient's Past Medical Hx, Surgical Hx, Family Hx, Social Hx, medications and allergies.   Physical Exam   Patient Vitals for the past 24 hrs:  Temp Temp src Pulse Resp SpO2  09/12/19 0803 98.1 F (  36.7 C) Oral (!) 112 18 100 %   Patient Vitals for the past 24 hrs:  BP  09/12/19 1109 122/69   Constitutional: Well-developed, well-nourished female in no acute distress.  HEART: normal rate, heart sounds, regular rhythm RESP: normal effort, lung sounds clear and equal bilaterally GI: Abd soft, non-tender, gravid appropriate for gestational age.  MS: Extremities nontender, no edema, normal ROM Neurologic: Alert and oriented x 4.  GU: Neg CVAT.  PELVIC EXAM: Cervix pink, visually closed, without lesion, scant white creamy discharge, vaginal walls and  external genitalia normal Bimanual exam: Cervix 0/long/high, firm, anterior, neg CMT, uterus nontender, nonenlarged, adnexa without tenderness, enlargement, or mass     FHT:  Baseline 140 , moderate variability, accelerations present, no decelerations Contractions: irregular, mild to palpation   Labs: No results found for this or any previous visit (from the past 24 hour(s)).    Imaging:  No results found.  MAU Course/MDM: Orders Placed This Encounter  Procedures  . SARS Coronavirus 2 by RT PCR (hospital order, performed in [facility] hospital lab) Nasopharyngeal Nasopharyngeal Swab  . Urinalysis, Routine w reflex microscopic  . Airborne and Contact precautions    No orders of the defined types were placed in this encounter.    NST reviewed and reactive No obstetric concerns or complaints, no evidence of labor Pt with URI, no fever COVID 19 test done Results positive. Discussed with pt her positive COVID 19 results.  Pt without SOB, chest pain, VS stable.  Minimal URI symptoms, pt denies any need for treatment for symptoms at this time.  Lung sounds clear and unlabored. D/C home with precautions/reasons to return to hospital Present to [facility] for shortness of breath, high fever, or other non-obstetric emergencies, to MAU for signs of labor or obstetric emergencies Reviewed safe OTC medications to take for symptom management and encouraged rest, increased PO fluids Discussed recommended isolation/quarantine for 10 days if illness remains minor and if symptoms resolve. Pt to notify work and her daughter's school to follow policies.  Pt to cancel appt in office 8/30 and discuss return to office based on guidelines. She is to keep scheduled repeat C/S on  Pt discharge with strict return precautions.   The patient was counseled on the potential benefits and lack of known risks of COVID vaccination, during pregnancy and breastfeeding, on today's visit. The patient's questions and  concerns were addressed today, including risks in pregnancy and breastfeeding. The patient is still unsure of her decision for vaccination. She is aware of recommendation to wait 90 days after active infection with COVID 19 to receive vaccine but that vaccine is still recommended with history of previous infection.  The patient is aware that if she chooses not to get an employee mandated vaccination we will provide documentation for her employers in the form of a letter unless a specific exemption form is submitted to the provider. The patient is aware that this documentation is a deferment of a mandated vaccination that will need to be renewed by a provider n/a.     Assessment:  1. Viral upper respiratory tract infection   2. Pregnancy with 37 weeks completed gestation   3. Lab test positive for detection of COVID-19 virus    Plan: Discharge home with COVID 19 and labor return precautions   Allergies as of 09/12/2019   No Known Allergies     Medication List    TAKE these medications   multivitamin-prenatal 27-0.8 MG Tabs tablet Take 1 tablet by  mouth daily at 12 noon.     ASK your doctor about these medications   valACYclovir 1000 MG tablet Commonly known as: VALTREX Take 1 tablet (1,000 mg total) by mouth 2 (two) times daily. Take for ten days.       Sharen Counter Certified Nurse-Midwife 09/12/2019 9:12 AM

## 2019-09-17 ENCOUNTER — Encounter (HOSPITAL_COMMUNITY): Payer: Self-pay

## 2019-09-17 NOTE — Patient Instructions (Signed)
Kristen Gomez  09/17/2019   Your procedure is scheduled on:  09/24/2019  Arrive at 0630 at [employer] on [address] at [facility]  and [employer]. You are invited to use the FREE valet parking or use the Visitor's parking deck.  Pick up the phone at the desk and dial [phone].  Call this number if you have problems the morning of surgery: [phone]  Remember:   Do not eat food:(After Midnight) Desps de medianoche.  Do not drink clear liquids: (After Midnight) Desps de medianoche.  Take these medicines the morning of surgery with A SIP OF WATER:  none   Do not wear jewelry, make-up or nail polish.  Do not wear lotions, powders, or perfumes. Do not wear deodorant.  Do not shave 48 hours prior to surgery.  Do not bring valuables to the hospital.  [facility] is not   responsible for any belongings or valuables brought to the hospital.  Contacts, dentures or bridgework may not be worn into surgery.  Leave suitcase in the car. After surgery it may be brought to your room.  For patients admitted to the hospital, checkout time is 11:00 AM the day of              discharge.      Please read over the following fact sheets that you were given:     Preparing for Surgery

## 2019-09-22 ENCOUNTER — Other Ambulatory Visit (HOSPITAL_COMMUNITY)
Admission: RE | Admit: 2019-09-22 | Discharge: 2019-09-22 | Disposition: A | Discharge status: Home / Self Care | Payer: Medicaid Other | Source: Ambulatory Visit | Attending: Obstetrics and Gynecology | Admitting: Obstetrics and Gynecology

## 2019-09-22 ENCOUNTER — Other Ambulatory Visit: Payer: Self-pay

## 2019-09-22 DIAGNOSIS — Z01812 Encounter for preprocedural laboratory examination: Secondary | ICD-10-CM | POA: Diagnosis present

## 2019-09-22 LAB — CBC
HCT: 36.1 % (ref 36.0–46.0)
Hemoglobin: 11.4 g/dL — ABNORMAL LOW (ref 12.0–15.0)
MCH: 28.4 pg (ref 26.0–34.0)
MCHC: 31.6 g/dL (ref 30.0–36.0)
MCV: 89.8 fL (ref 80.0–100.0)
Platelets: 142 10*3/uL — ABNORMAL LOW (ref 150–400)
RBC: 4.02 MIL/uL (ref 3.87–5.11)
RDW: 13.6 % (ref 11.5–15.5)
WBC: 3.6 10*3/uL — ABNORMAL LOW (ref 4.0–10.5)
nRBC: 0 % (ref 0.0–0.2)

## 2019-09-22 LAB — RPR: RPR Ser Ql: NONREACTIVE

## 2019-09-22 NOTE — H&P (Signed)
Kristen Gomez is a 24 y.o. female G3P2002 at 39+ for repeat LTCS.  Pt diagnosed w covid 8/27, at last televisit had cough, but otherwise felt well.  D/w pt r/b/a of rLTCS and process.  Pregnancy also complicated by rubella nonimmune - vaccine PP; maternal obesity, eczema and history of LTCS x 2.  refued Tdap.  Dated by LMP c/w [place].  Had previa - resolved.    OB History    Gravida  3   Para  2   Term  2   Preterm      AB      Living  2     SAB      TAB      Ectopic      Multiple  0   Live Births  2         G1 9/16 LTCS F G2 12/19 rLTCS F G3 present  No abn pap H/o Chl  Past Medical History:  Diagnosis Date  . Anxiety   . Headache   . History of cesarean section, low transverse 12/28/2017  . S/P cesarean section 09/30/2014  . UTI (urinary tract infection)    Past Surgical History:  Procedure Laterality Date  . CESAREAN SECTION N/A 09/30/2014   Procedure: CESAREAN SECTION;  Surgeon: Sherian Rein, MD;  Location: [facility];  Service: Obstetrics;  Laterality: N/A;  . CESAREAN SECTION N/A 12/31/2017   Procedure: REPEAT CESAREAN SECTION;  Surgeon: Sherian Rein, MD;  Location: [facility];  Service: Obstetrics;  Laterality: N/A;  [name],  RNFA  . TONSILLECTOMY     Family History: family history includes Healthy in her father and mother. HTN, DM, lung CA, lypothyroidism Social History:  reports that she has never smoked. She has never used smokeless tobacco. She reports that she does not drink alcohol and does not use drugs. engaged  Meds PNV, Valtrex, pepcid, triamcinolone All NKDA     Maternal Diabetes: No Genetic Screening: Normal Maternal Ultrasounds/Referrals: Normal Fetal Ultrasounds or other Referrals:  None Maternal Substance Abuse:  No Significant Maternal Medications:  None Significant Maternal Lab Results:  Group B Strep positive Other Comments:  COVID + 8/27  Review of Systems  Constitutional: Positive for activity change  and fatigue.  HENT: Negative.   Respiratory: Positive for cough.   Cardiovascular: Negative.   Gastrointestinal: Negative.   Genitourinary: Negative.   Musculoskeletal: Negative.   Skin: Negative.   Neurological: Negative.   Psychiatric/Behavioral: Negative.    Maternal Medical History:  Reason for admission: Repeat LTCS  Fetal activity: Perceived fetal activity is normal.    Prenatal complications: COVID dx'd 8/27  Prenatal Complications - Diabetes: none.      Last menstrual period 12/23/2018, not currently breastfeeding. Maternal Exam:  Uterine Assessment: Contraction frequency is irregular.   Abdomen: Patient reports no abdominal tenderness. Fundal height is appropriate for gestation.   Estimated fetal weight is 7-8#.   Fetal presentation: vertex  Introitus: Normal vulva. Normal vagina.    Physical Exam Constitutional:      Appearance: Normal appearance.  HENT:     Head: Normocephalic and atraumatic.  Cardiovascular:     Rate and Rhythm: Normal rate and regular rhythm.  Pulmonary:     Effort: Pulmonary effort is normal.     Breath sounds: Normal breath sounds.  Abdominal:     General: Bowel sounds are normal.     Palpations: Abdomen is soft.     Comments: GRAVID  Genitourinary:    General: Normal vulva.  Musculoskeletal:        General: Normal range of motion.     Cervical back: Normal range of motion and neck supple.  Skin:    General: Skin is warm and dry.  Neurological:     General: No focal deficit present.     Mental Status: She is alert and oriented to person, place, and time.  Psychiatric:        Mood and Affect: Mood normal.        Behavior: Behavior normal.     Prenatal labs: ABO, Rh: --/--/O POS (09/06 3300) Antibody: NEG (09/06 0837) Rubella:  nonimmune RPR:   NR HBsAg:  neg HIV:   neg GBS:   positive  Hgb 12.1/Plt 209/Ur Cx neg/GC neg/Chl neg/ Varicella nonimmune/hgb electro WNL/Hep C neg/glucola 84/ Panorama low risk,  female  Dated by LMP cw [place]  Nl anat, post previa, female Previa resolved Good growth  Assessment/Plan: 23yo G3P2002 at 37 for rLTCS Ancef for prophylaxis D/w pt r/b/a and process of rLTCS D/w pt recent covid + and protocols in OR if symptomatic Rubella and varicella nonimmune vaccines PP    Tamarra Geiselman Bovard-Stuckert 09/22/2019, 11:36 AM

## 2019-09-23 ENCOUNTER — Encounter (HOSPITAL_COMMUNITY): Payer: Self-pay | Admitting: Obstetrics and Gynecology

## 2019-09-23 NOTE — Anesthesia Preprocedure Evaluation (Addendum)
Anesthesia Evaluation  Patient identified by MRN, date of birth, ID band Patient awake    Reviewed: Allergy & Precautions, NPO status , Patient's Chart, lab work & pertinent test results  Airway Mallampati: II  TM Distance: >3 FB Neck ROM: Full    Dental  (+) Teeth Intact, Dental Advisory Given   Pulmonary neg pulmonary ROS,    breath sounds clear to auscultation       Cardiovascular negative cardio ROS   Rhythm:Regular Rate:Normal     Neuro/Psych  Headaches, PSYCHIATRIC DISORDERS Anxiety    GI/Hepatic negative GI ROS, Neg liver ROS,   Endo/Other  negative endocrine ROS  Renal/GU negative Renal ROS     Musculoskeletal negative musculoskeletal ROS (+)   Abdominal Normal abdominal exam  (+)   Peds  Hematology negative hematology ROS (+)   Anesthesia Other Findings   Reproductive/Obstetrics                            Anesthesia Physical Anesthesia Plan  ASA: II  Anesthesia Plan: Spinal   Post-op Pain Management:    Induction:   PONV Risk Score and Plan: 0  Airway Management Planned: Natural Airway and Simple Face Mask  Additional Equipment: None  Intra-op Plan:   Post-operative Plan:   Informed Consent: I have reviewed the patients History and Physical, chart, labs and discussed the procedure including the risks, benefits and alternatives for the proposed anesthesia with the patient or authorized representative who has indicated his/her understanding and acceptance.       Plan Discussed with: CRNA  Anesthesia Plan Comments:        Anesthesia Quick Evaluation

## 2019-09-24 ENCOUNTER — Encounter (HOSPITAL_COMMUNITY)
Admission: RE | Disposition: A | Discharge status: Home / Self Care | Payer: Self-pay | Source: Home / Self Care | Attending: Obstetrics and Gynecology

## 2019-09-24 ENCOUNTER — Inpatient Hospital Stay (HOSPITAL_COMMUNITY)
Admission: RE | Admit: 2019-09-24 | Discharge: 2019-09-27 | DRG: 787 | Disposition: A | Discharge status: Home / Self Care | Payer: Medicaid Other | Attending: Obstetrics and Gynecology | Admitting: Obstetrics and Gynecology

## 2019-09-24 ENCOUNTER — Encounter (HOSPITAL_COMMUNITY): Payer: Self-pay | Admitting: Obstetrics and Gynecology

## 2019-09-24 ENCOUNTER — Inpatient Hospital Stay (HOSPITAL_COMMUNITY): Payer: Medicaid Other | Admitting: Anesthesiology

## 2019-09-24 ENCOUNTER — Other Ambulatory Visit: Payer: Self-pay

## 2019-09-24 DIAGNOSIS — Z8616 Personal history of COVID-19: Secondary | ICD-10-CM

## 2019-09-24 DIAGNOSIS — O34211 Maternal care for low transverse scar from previous cesarean delivery: Secondary | ICD-10-CM | POA: Diagnosis present

## 2019-09-24 DIAGNOSIS — D62 Acute posthemorrhagic anemia: Secondary | ICD-10-CM | POA: Diagnosis present

## 2019-09-24 DIAGNOSIS — O99824 Streptococcus B carrier state complicating childbirth: Secondary | ICD-10-CM | POA: Diagnosis present

## 2019-09-24 DIAGNOSIS — O99214 Obesity complicating childbirth: Secondary | ICD-10-CM | POA: Diagnosis present

## 2019-09-24 DIAGNOSIS — E669 Obesity, unspecified: Secondary | ICD-10-CM | POA: Diagnosis present

## 2019-09-24 DIAGNOSIS — Z3A39 39 weeks gestation of pregnancy: Secondary | ICD-10-CM

## 2019-09-24 DIAGNOSIS — Z98891 History of uterine scar from previous surgery: Secondary | ICD-10-CM

## 2019-09-24 DIAGNOSIS — O9081 Anemia of the puerperium: Secondary | ICD-10-CM | POA: Diagnosis not present

## 2019-09-24 LAB — CBC
HCT: 29.9 % — ABNORMAL LOW (ref 36.0–46.0)
HCT: 24.7 % — ABNORMAL LOW (ref 36.0–46.0)
Hemoglobin: 9.4 g/dL — ABNORMAL LOW (ref 12.0–15.0)
Hemoglobin: 8 g/dL — ABNORMAL LOW (ref 12.0–15.0)
MCH: 28.7 pg (ref 26.0–34.0)
MCH: 29.3 pg (ref 26.0–34.0)
MCHC: 31.4 g/dL (ref 30.0–36.0)
MCHC: 32.4 g/dL (ref 30.0–36.0)
MCV: 91.4 fL (ref 80.0–100.0)
MCV: 90.5 fL (ref 80.0–100.0)
Platelets: 145 10*3/uL — ABNORMAL LOW (ref 150–400)
Platelets: 136 10*3/uL — ABNORMAL LOW (ref 150–400)
RBC: 3.27 MIL/uL — ABNORMAL LOW (ref 3.87–5.11)
RBC: 2.73 MIL/uL — ABNORMAL LOW (ref 3.87–5.11)
RDW: 13.9 % (ref 11.5–15.5)
RDW: 14 % (ref 11.5–15.5)
WBC: 10.4 10*3/uL (ref 4.0–10.5)
WBC: 11.1 10*3/uL — ABNORMAL HIGH (ref 4.0–10.5)
nRBC: 0.2 % (ref 0.0–0.2)
nRBC: 0.4 % — ABNORMAL HIGH (ref 0.0–0.2)

## 2019-09-24 LAB — CREATININE, SERUM
Creatinine, Ser: 0.89 mg/dL (ref 0.44–1.00)
GFR calc Af Amer: >60 mL/min (ref >60)
GFR calc non Af Amer: >60 mL/min (ref >60)

## 2019-09-24 SURGERY — Surgical Case
Anesthesia: Spinal

## 2019-09-24 MED ORDER — KETOROLAC TROMETHAMINE 30 MG/ML IJ SOLN
30.0000 mg | Freq: Four times a day (QID) | INTRAMUSCULAR | Status: DC | PRN
  Administered 2019-09-24: 30 mg via INTRAVENOUS

## 2019-09-24 MED ORDER — KETOROLAC TROMETHAMINE 30 MG/ML IJ SOLN
INTRAMUSCULAR | Status: AC
  Filled 2019-09-24: qty 1

## 2019-09-24 MED ORDER — BUPIVACAINE IN DEXTROSE 0.75-8.25 % IT SOLN
INTRATHECAL | Status: DC | PRN
  Administered 2019-09-24: 1.4 mL via INTRATHECAL

## 2019-09-24 MED ORDER — CEFAZOLIN SODIUM-DEXTROSE 2-4 GM/100ML-% IV SOLN
INTRAVENOUS | Status: AC
  Filled 2019-09-24: qty 100

## 2019-09-24 MED ORDER — CEFAZOLIN SODIUM-DEXTROSE 2-4 GM/100ML-% IV SOLN: 2.0000 g | INTRAVENOUS | Status: DC

## 2019-09-24 MED ORDER — SODIUM CHLORIDE 0.9 % IV SOLN: INTRAVENOUS | Status: DC | PRN

## 2019-09-24 MED ORDER — SENNOSIDES-DOCUSATE SODIUM 8.6-50 MG PO TABS
2.0000 | ORAL_TABLET | ORAL | Status: DC
  Administered 2019-09-25 – 2019-09-26 (×3): 2 via ORAL
  Filled 2019-09-24 (×3): qty 2

## 2019-09-24 MED ORDER — FENTANYL CITRATE (PF) 100 MCG/2ML IJ SOLN
INTRAMUSCULAR | Status: DC | PRN
  Administered 2019-09-24: 15 ug via INTRATHECAL

## 2019-09-24 MED ORDER — ACETAMINOPHEN 500 MG PO TABS
1000.0000 mg | ORAL_TABLET | Freq: Four times a day (QID) | ORAL | Status: DC
  Administered 2019-09-24 – 2019-09-27 (×12): 1000 mg via ORAL
  Filled 2019-09-24 (×14): qty 2

## 2019-09-24 MED ORDER — FENTANYL CITRATE (PF) 100 MCG/2ML IJ SOLN: 25.0000 ug | INTRAMUSCULAR | Status: DC | PRN

## 2019-09-24 MED ORDER — SIMETHICONE 80 MG PO CHEW
80.0000 mg | CHEWABLE_TABLET | ORAL | Status: DC
  Administered 2019-09-25 (×2): 80 mg via ORAL
  Filled 2019-09-24 (×3): qty 1

## 2019-09-24 MED ORDER — TRANEXAMIC ACID-NACL 1000-0.7 MG/100ML-% IV SOLN
INTRAVENOUS | Status: AC
  Administered 2019-09-24: 1000 mg via INTRAVENOUS
  Filled 2019-09-24: qty 100

## 2019-09-24 MED ORDER — FENTANYL CITRATE (PF) 100 MCG/2ML IJ SOLN
INTRAMUSCULAR | Status: AC
  Administered 2019-09-24: 25 ug via INTRAVENOUS
  Filled 2019-09-24: qty 4

## 2019-09-24 MED ORDER — FENTANYL CITRATE (PF) 100 MCG/2ML IJ SOLN
INTRAMUSCULAR | Status: AC
  Filled 2019-09-24: qty 2

## 2019-09-24 MED ORDER — NALBUPHINE HCL 10 MG/ML IJ SOLN: 5.0000 mg | INTRAMUSCULAR | Status: DC | PRN

## 2019-09-24 MED ORDER — EPHEDRINE SULFATE 50 MG/ML IJ SOLN
INTRAMUSCULAR | Status: DC | PRN
  Administered 2019-09-24: 25 mg via INTRAVENOUS

## 2019-09-24 MED ORDER — MEPERIDINE HCL 25 MG/ML IJ SOLN: 6.2500 mg | INTRAMUSCULAR | Status: DC | PRN

## 2019-09-24 MED ORDER — SCOPOLAMINE 1 MG/3DAYS TD PT72: 1.0000 | MEDICATED_PATCH | Freq: Once | TRANSDERMAL | Status: DC

## 2019-09-24 MED ORDER — DEXAMETHASONE SODIUM PHOSPHATE 10 MG/ML IJ SOLN
INTRAMUSCULAR | Status: DC | PRN
  Administered 2019-09-24: 10 mg via INTRAVENOUS

## 2019-09-24 MED ORDER — NALBUPHINE HCL 10 MG/ML IJ SOLN
5.0000 mg | Freq: Once | INTRAMUSCULAR | Status: AC | PRN
  Administered 2019-09-24: 5 mg via INTRAVENOUS

## 2019-09-24 MED ORDER — ONDANSETRON HCL 4 MG/2ML IJ SOLN: 4.0000 mg | Freq: Three times a day (TID) | INTRAMUSCULAR | Status: DC | PRN

## 2019-09-24 MED ORDER — LACTATED RINGERS IV SOLN: INTRAVENOUS | Status: DC

## 2019-09-24 MED ORDER — PRENATAL MULTIVITAMIN CH
1.0000 | ORAL_TABLET | Freq: Every day | ORAL | Status: DC
  Administered 2019-09-25 – 2019-09-27 (×3): 1 via ORAL
  Filled 2019-09-24 (×3): qty 1

## 2019-09-24 MED ORDER — KETOROLAC TROMETHAMINE 30 MG/ML IJ SOLN: 30.0000 mg | Freq: Four times a day (QID) | INTRAMUSCULAR | Status: DC | PRN

## 2019-09-24 MED ORDER — PHENYLEPHRINE HCL-NACL 20-0.9 MG/250ML-% IV SOLN
INTRAVENOUS | Status: AC
  Filled 2019-09-24: qty 250

## 2019-09-24 MED ORDER — LACTATED RINGERS IV SOLN: INTRAVENOUS | Status: DC | PRN

## 2019-09-24 MED ORDER — TRANEXAMIC ACID-NACL 1000-0.7 MG/100ML-% IV SOLN: 1000.0000 mg | INTRAVENOUS | Status: AC

## 2019-09-24 MED ORDER — PHENYLEPHRINE HCL-NACL 20-0.9 MG/250ML-% IV SOLN
INTRAVENOUS | Status: DC | PRN
  Administered 2019-09-24: 60 ug/min via INTRAVENOUS

## 2019-09-24 MED ORDER — DIPHENHYDRAMINE HCL 50 MG/ML IJ SOLN: 12.5000 mg | INTRAMUSCULAR | Status: DC | PRN

## 2019-09-24 MED ORDER — AMISULPRIDE (ANTIEMETIC) 5 MG/2ML IV SOLN
10.0000 mg | Freq: Once | INTRAVENOUS | Status: DC | PRN
  Filled 2019-09-24: qty 4

## 2019-09-24 MED ORDER — DIPHENHYDRAMINE HCL 25 MG PO CAPS: 25.0000 mg | ORAL_CAPSULE | Freq: Four times a day (QID) | ORAL | Status: DC | PRN

## 2019-09-24 MED ORDER — IBUPROFEN 800 MG PO TABS
800.0000 mg | ORAL_TABLET | Freq: Three times a day (TID) | ORAL | Status: AC
  Administered 2019-09-24 – 2019-09-27 (×9): 800 mg via ORAL
  Filled 2019-09-24 (×9): qty 1

## 2019-09-24 MED ORDER — MENTHOL 3 MG MT LOZG: 1.0000 | LOZENGE | OROMUCOSAL | Status: DC | PRN

## 2019-09-24 MED ORDER — ACETAMINOPHEN 325 MG PO TABS: 325.0000 mg | ORAL_TABLET | Freq: Once | ORAL | Status: DC | PRN

## 2019-09-24 MED ORDER — ACETAMINOPHEN 10 MG/ML IV SOLN: 1000.0000 mg | Freq: Once | INTRAVENOUS | Status: DC | PRN

## 2019-09-24 MED ORDER — OXYTOCIN-SODIUM CHLORIDE 30-0.9 UT/500ML-% IV SOLN
INTRAVENOUS | Status: DC | PRN
  Administered 2019-09-24: 30 [IU] via INTRAVENOUS

## 2019-09-24 MED ORDER — MISOPROSTOL 200 MCG PO TABS
ORAL_TABLET | ORAL | Status: AC
  Filled 2019-09-24: qty 4

## 2019-09-24 MED ORDER — NALBUPHINE HCL 10 MG/ML IJ SOLN: 5.0000 mg | Freq: Once | INTRAMUSCULAR | Status: AC | PRN

## 2019-09-24 MED ORDER — NALOXONE HCL 4 MG/10ML IJ SOLN
1.0000 ug/kg/h | INTRAVENOUS | Status: DC | PRN
  Filled 2019-09-24: qty 5

## 2019-09-24 MED ORDER — ONDANSETRON HCL 4 MG/2ML IJ SOLN
INTRAMUSCULAR | Status: AC
  Filled 2019-09-24: qty 2

## 2019-09-24 MED ORDER — ONDANSETRON HCL 4 MG/2ML IJ SOLN
INTRAMUSCULAR | Status: DC | PRN
  Administered 2019-09-24: 4 mg via INTRAVENOUS

## 2019-09-24 MED ORDER — FENTANYL CITRATE (PF) 100 MCG/2ML IJ SOLN
25.0000 ug | INTRAMUSCULAR | Status: DC | PRN
  Administered 2019-09-24: 25 ug via INTRAVENOUS

## 2019-09-24 MED ORDER — MORPHINE SULFATE (PF) 0.5 MG/ML IJ SOLN
INTRAMUSCULAR | Status: DC | PRN
Start: 2019-09-24 — End: 2019-09-24
  Administered 2019-09-24: .15 mg via INTRATHECAL

## 2019-09-24 MED ORDER — SODIUM CHLORIDE 0.9% FLUSH: 3.0000 mL | INTRAVENOUS | Status: DC | PRN

## 2019-09-24 MED ORDER — ACETAMINOPHEN 160 MG/5ML PO SOLN: 325.0000 mg | Freq: Once | ORAL | Status: DC | PRN

## 2019-09-24 MED ORDER — POVIDONE-IODINE 10 % EX SWAB
2.0000 "application " | Freq: Once | CUTANEOUS | Status: AC
  Administered 2019-09-24: 2 via TOPICAL

## 2019-09-24 MED ORDER — TRANEXAMIC ACID-NACL 1000-0.7 MG/100ML-% IV SOLN
1000.0000 mg | INTRAVENOUS | Status: AC
  Administered 2019-09-24: 1000 mg via INTRAVENOUS

## 2019-09-24 MED ORDER — SIMETHICONE 80 MG PO CHEW: 80.0000 mg | CHEWABLE_TABLET | ORAL | Status: DC | PRN

## 2019-09-24 MED ORDER — PNV TABS 29-1 29-1 MG PO TABS: 1.0000 | ORAL_TABLET | Freq: Every day | ORAL | Status: DC

## 2019-09-24 MED ORDER — ACETAMINOPHEN 500 MG PO TABS
1000.0000 mg | ORAL_TABLET | ORAL | Status: AC
  Administered 2019-09-24: 1000 mg via ORAL

## 2019-09-24 MED ORDER — MORPHINE SULFATE (PF) 0.5 MG/ML IJ SOLN
INTRAMUSCULAR | Status: AC
  Filled 2019-09-24: qty 10

## 2019-09-24 MED ORDER — SIMETHICONE 80 MG PO CHEW
80.0000 mg | CHEWABLE_TABLET | Freq: Three times a day (TID) | ORAL | Status: DC
  Administered 2019-09-25 – 2019-09-27 (×9): 80 mg via ORAL
  Filled 2019-09-24 (×8): qty 1

## 2019-09-24 MED ORDER — OXYCODONE HCL 5 MG PO TABS
5.0000 mg | ORAL_TABLET | ORAL | Status: DC | PRN
  Administered 2019-09-25 – 2019-09-26 (×5): 5 mg via ORAL
  Administered 2019-09-27: 10 mg via ORAL
  Filled 2019-09-24 (×2): qty 1
  Filled 2019-09-24: qty 2
  Filled 2019-09-24 (×3): qty 1

## 2019-09-24 MED ORDER — OXYTOCIN-SODIUM CHLORIDE 30-0.9 UT/500ML-% IV SOLN
INTRAVENOUS | Status: AC
  Filled 2019-09-24: qty 500

## 2019-09-24 MED ORDER — OXYTOCIN-SODIUM CHLORIDE 30-0.9 UT/500ML-% IV SOLN
2.5000 [IU]/h | INTRAVENOUS | Status: DC
  Filled 2019-09-24: qty 500

## 2019-09-24 MED ORDER — NALBUPHINE HCL 10 MG/ML IJ SOLN
INTRAMUSCULAR | Status: AC
  Filled 2019-09-24: qty 1

## 2019-09-24 MED ORDER — ZOLPIDEM TARTRATE 5 MG PO TABS: 5.0000 mg | ORAL_TABLET | Freq: Every evening | ORAL | Status: DC | PRN

## 2019-09-24 MED ORDER — ACETAMINOPHEN 500 MG PO TABS
ORAL_TABLET | ORAL | Status: AC
  Filled 2019-09-24: qty 2

## 2019-09-24 MED ORDER — LACTATED RINGERS IV BOLUS
500.0000 mL | Freq: Once | INTRAVENOUS | Status: AC
  Administered 2019-09-24: 500 mL via INTRAVENOUS

## 2019-09-24 MED ORDER — COCONUT OIL OIL: 1.0000 "application " | TOPICAL_OIL | Status: DC | PRN

## 2019-09-24 MED ORDER — TRANEXAMIC ACID-NACL 1000-0.7 MG/100ML-% IV SOLN
INTRAVENOUS | Status: AC
  Filled 2019-09-24: qty 100

## 2019-09-24 MED ORDER — DIPHENHYDRAMINE HCL 25 MG PO CAPS: 25.0000 mg | ORAL_CAPSULE | ORAL | Status: DC | PRN

## 2019-09-24 MED ORDER — SODIUM CHLORIDE 0.9 % IV SOLN
510.0000 mg | Freq: Once | INTRAVENOUS | Status: AC
  Administered 2019-09-24: 510 mg via INTRAVENOUS
  Filled 2019-09-24: qty 17

## 2019-09-24 MED ORDER — CEFAZOLIN SODIUM-DEXTROSE 2-3 GM-%(50ML) IV SOLR
INTRAVENOUS | Status: DC | PRN
  Administered 2019-09-24: 2 g via INTRAVENOUS

## 2019-09-24 MED ORDER — NALOXONE HCL 0.4 MG/ML IJ SOLN: 0.4000 mg | INTRAMUSCULAR | Status: DC | PRN

## 2019-09-24 MED ORDER — ENOXAPARIN SODIUM 60 MG/0.6ML ~~LOC~~ SOLN
50.0000 mg | SUBCUTANEOUS | Status: DC
  Administered 2019-09-25 – 2019-09-27 (×3): 50 mg via SUBCUTANEOUS
  Filled 2019-09-24 (×3): qty 0.6

## 2019-09-24 MED ORDER — DEXAMETHASONE SODIUM PHOSPHATE 10 MG/ML IJ SOLN
INTRAMUSCULAR | Status: AC
  Filled 2019-09-24: qty 1

## 2019-09-24 MED ORDER — MISOPROSTOL 200 MCG PO TABS
800.0000 ug | ORAL_TABLET | Freq: Once | ORAL | Status: AC
  Administered 2019-09-24: 800 ug via RECTAL

## 2019-09-24 SURGICAL SUPPLY — 37 items
BENZOIN TINCTURE PRP APPL 2/3 (GAUZE/BANDAGES/DRESSINGS) ×3 IMPLANT
CHLORAPREP W/TINT 26ML (MISCELLANEOUS) ×3 IMPLANT
CLAMP CORD UMBIL (MISCELLANEOUS) IMPLANT
CLOSURE STERI STRIP 1/2 X4 (GAUZE/BANDAGES/DRESSINGS) ×2 IMPLANT
CLOSURE WOUND 1/2 X4 (GAUZE/BANDAGES/DRESSINGS) ×1
CLOTH BEACON ORANGE TIMEOUT ST (SAFETY) ×3 IMPLANT
DRSG OPSITE POSTOP 4X10 (GAUZE/BANDAGES/DRESSINGS) ×3 IMPLANT
ELECT REM PT RETURN 9FT ADLT (ELECTROSURGICAL) ×3
ELECTRODE REM PT RTRN 9FT ADLT (ELECTROSURGICAL) ×1 IMPLANT
EXTRACTOR VACUUM M CUP 4 TUBE (SUCTIONS) IMPLANT
EXTRACTOR VACUUM M CUP 4' TUBE (SUCTIONS)
GLOVE BIO SURGEON STRL SZ 6.5 (GLOVE) ×2 IMPLANT
GLOVE BIO SURGEONS STRL SZ 6.5 (GLOVE) ×1
GLOVE BIOGEL PI IND STRL 7.0 (GLOVE) ×1 IMPLANT
GLOVE BIOGEL PI INDICATOR 7.0 (GLOVE) ×2
GOWN STRL REUS W/TWL LRG LVL3 (GOWN DISPOSABLE) ×6 IMPLANT
KIT ABG SYR 3ML LUER SLIP (SYRINGE) IMPLANT
NEEDLE HYPO 25X5/8 SAFETYGLIDE (NEEDLE) IMPLANT
NS IRRIG 1000ML POUR BTL (IV SOLUTION) ×3 IMPLANT
PACK C SECTION WH (CUSTOM PROCEDURE TRAY) ×3 IMPLANT
PAD OB MATERNITY 4.3X12.25 (PERSONAL CARE ITEMS) ×3 IMPLANT
PENCIL SMOKE EVAC W/HOLSTER (ELECTROSURGICAL) ×3 IMPLANT
RTRCTR C-SECT PINK 25CM LRG (MISCELLANEOUS) ×3 IMPLANT
STRIP CLOSURE SKIN 1/2X4 (GAUZE/BANDAGES/DRESSINGS) ×2 IMPLANT
SUT MNCRL 0 VIOLET CTX 36 (SUTURE) ×2 IMPLANT
SUT MONOCRYL 0 CTX 36 (SUTURE) ×4
SUT PLAIN 1 NONE 54 (SUTURE) IMPLANT
SUT PLAIN 2 0 XLH (SUTURE) ×3 IMPLANT
SUT VIC AB 0 CT1 27 (SUTURE) ×4
SUT VIC AB 0 CT1 27XBRD ANBCTR (SUTURE) ×2 IMPLANT
SUT VIC AB 2-0 CT1 27 (SUTURE) ×2
SUT VIC AB 2-0 CT1 TAPERPNT 27 (SUTURE) ×1 IMPLANT
SUT VIC AB 4-0 KS 27 (SUTURE) ×3 IMPLANT
SYR BULB IRRIGATION 50ML (SYRINGE) ×3 IMPLANT
TOWEL OR 17X24 6PK STRL BLUE (TOWEL DISPOSABLE) ×3 IMPLANT
TRAY FOLEY W/BAG SLVR 14FR LF (SET/KITS/TRAYS/PACK) ×3 IMPLANT
WATER STERILE IRR 1000ML POUR (IV SOLUTION) ×3 IMPLANT

## 2019-09-24 NOTE — Progress Notes (Signed)
MD notified of insufficient urine output. Order received for LR Bolus.

## 2019-09-24 NOTE — Anesthesia Procedure Notes (Signed)
Spinal  Start time: 09/24/2019 8:29 AM End time: 09/24/2019 8:31 AM Staffing Performed: anesthesiologist  Anesthesiologist: Shelton Silvas, MD Preanesthetic Checklist Completed: patient identified, IV checked, site marked, risks and benefits discussed, surgical consent, monitors and equipment checked, pre-op evaluation and timeout performed Spinal Block Patient position: sitting Prep: DuraPrep and site prepped and draped Location: L3-4 Injection technique: single-shot Needle Needle type: Pencan  Needle gauge: 24 G Needle length: 10 cm Needle insertion depth: 10 cm Additional Notes Patient tolerated well. No immediate complications.

## 2019-09-24 NOTE — Interval H&P Note (Signed)
History and Physical Interval Note:  09/24/2019 8:01 AM  Kristen Gomez  has presented today for surgery, with the diagnosis of repeat c-section.  The various methods of treatment have been discussed with the patient and family. After consideration of risks, benefits and other options for treatment, the patient has consented to  Procedure(s) with comments: CESAREAN SECTION (N/A) - Tracey RNFA as a surgical intervention.  The patient's history has been reviewed, patient examined, no change in status, stable for surgery.  I have reviewed the patient's chart and labs.  Questions were answered to the patient's satisfaction.     [name]

## 2019-09-24 NOTE — Transfer of Care (Signed)
Immediate Anesthesia Transfer of Care Note  Patient: Kristen Gomez  Procedure(s) Performed: CESAREAN SECTION (N/A )  Patient Location: PACU  Anesthesia Type:Spinal  Level of Consciousness: awake, alert  and oriented  Airway & Oxygen Therapy: Patient Spontanous Breathing and Patient connected to nasal cannula oxygen  Post-op Assessment: Report given to RN and Post -op Vital signs reviewed and stable  Post vital signs: Reviewed and stable  Last Vitals:  Vitals Value Taken Time  BP 94/63 09/24/19 0938  Temp    Pulse 111 09/24/19 0940  Resp 17 09/24/19 0940  SpO2 96 % 09/24/19 0940  Vitals shown include unvalidated device data.  Last Pain: There were no vitals filed for this visit.       Complications: No complications documented.

## 2019-09-24 NOTE — Progress Notes (Signed)
Pt noted to have moderate bleeding with clots at 1230 1 hour assessment (727 ml) fundus firm 1/U. 1300 Bovard at bedside - fundus 2/U manual sweep many clots removed (867 ml). Cytotec 800 mcg given PR per order. Bleeding still moderate at 1330 assessment (68 ml). Rapid response RN at bedside 1400 (per MD request). Fundus 2/U few clots and moderate bleeding with massage (85 ml). TXA and LR bolus given per order.

## 2019-09-24 NOTE — Lactation Note (Signed)
This note was copied from a baby's chart. Lactation Consultation Note  Patient Name: Girl Tatum Corl WCHEN'I Date: 09/24/2019  Mom is a g3.  Mom with significant blood loss with csection.  Mom sleepy on arrival.  Holding baby swaddled.  Mom reports baby girl Marsh Dolly now 9 hours is breastfeeding well.  Mom reports her first baby did not latch and she pumped for 2 months.  Mom reports second baby latched and breastfed for three months. Mom reports she has a personal DEBP at home.  Mom inquired about a manual pump.  Gave mom manual pump.  Did not demo.  Mom reports she knows how to use it.  Urged mom to feed on cue and 8-12 or more times day.  Discussed lots of stimulation. Reviewed and left [facility] breastfeeding Consultation services handout.  Urged mom to call lactation as needed.  Praised decision to breastfeed with this baby as well.    Maternal Data    Feeding Feeding Type: Formula Nipple Type: Slow - flow  LATCH Score                   Interventions    Lactation Tools Discussed/Used     Consult Status      Nadiah Corbit MESHELLE HOLNESS 09/24/2019, 6:39 PM

## 2019-09-24 NOTE — Anesthesia Postprocedure Evaluation (Signed)
Anesthesia Post Note  Patient: Maragret Vanacker  Procedure(s) Performed: CESAREAN SECTION (N/A )     Patient location during evaluation: PACU Anesthesia Type: Spinal Level of consciousness: oriented and awake and alert Pain management: pain level controlled Vital Signs Assessment: post-procedure vital signs reviewed and stable Respiratory status: spontaneous breathing, respiratory function stable and patient connected to nasal cannula oxygen Cardiovascular status: blood pressure returned to baseline and stable Postop Assessment: no headache, no backache and no apparent nausea or vomiting Anesthetic complications: no   No complications documented.  Last Vitals:  Vitals:   09/24/19 1420 09/24/19 1445  BP: 106/74 117/86  Pulse: (!) 102 (!) 102  Resp: 20   Temp: 36.8 C   SpO2: 100%     Last Pain:  Vitals:   09/24/19 1420  TempSrc: Oral  PainSc:    Pain Goal:                   Shelton Silvas

## 2019-09-24 NOTE — Clinical Social Work Note (Addendum)
MOB was referred for history of depression/anxiety.   * Referral screened out by Clinical Social Worker because none of the following criteria appear to apply: ~ History of anxiety/depression during this pregnancy, or of post-partum depression following prior delivery. ~ Diagnosis of anxiety and/or depression within last 3 years OR * MOB's symptoms currently being treated with medication and/or therapy.  Please contact the Clinical Social Worker if needs arise, by [facility] request, or if MOB scores greater than 9/yes to question 10 on Edinburgh Postpartum Depression Screen.  Manfred Arch, LCSWA [facility] [employer]

## 2019-09-24 NOTE — Op Note (Signed)
NAME: Kristen Gomez, HUMM MEDICAL RECORD [id number] ACCOUNT [id number] DATE OF BIRTH:November 02, 1995 FACILITY: [facility] LOCATION: [facility] PHYSICIAN:Keldan Eplin BOVARD-STUCKERT, MD  OPERATIVE REPORT  DATE OF PROCEDURE:  09/24/2019  PREOPERATIVE DIAGNOSIS:  Intrauterine pregnancy at term with history of low transverse cesarean section x2 for repeat low transverse cesarean section.  POSTOPERATIVE DIAGNOSIS:  Intrauterine pregnancy at term with history of low transverse cesarean section x2 for repeat low transverse cesarean section, delivered.  PROCEDURE:  Low transverse cesarean section.  SURGEON:  Sherian Rein, MD  ASSISTANT:  Mamie Levers, RNFA.  ANESTHESIA:  Spinal.  IV FLUIDS:  Per anesthesia.  ESTIMATED BLOOD LOSS:  467 mL.   FINDINGS:  Viable female infant at 8:57 a.m. with Apgars pending and weight pending at the time of dictation.  Normal tubes and ovaries noted.  Uterovesical, utero-peritoneal adhesions at the time of surgery.  They were reduced to facilitate delivery.  COMPLICATIONS:  None.  PATHOLOGY:  Placenta to labor and delivery.  DESCRIPTION OF PROCEDURE:  After informed consent was reviewed with the patient, she was transported to the operating room where spinal anesthesia was placed and found to be an adequate level.  She was then prepped and draped in the normal sterile  fashion.  A Foley catheter was sterilely placed.  After an appropriate timeout, a Pfannenstiel skin incision was made at the level of her previous incision, carried through the underlying layer of fascia sharply.  The fascia was incised in the midline.   The incision was extended laterally with Mayo scissors.  The superior aspect of the fascial incision was grasped with Kocher clamps, elevated and the rectus muscles were dissected off both bluntly and sharply.  Midline was easily identified and the  peritoneum was entered bluntly.  The incision extended superiorly and inferiorly with good visualization of  the bladder.  An Alexis skin retractor was placed, carefully making sure that no bowel was entrapped.  Prior to the ability to place the retractor  a utero- peritoneal adhesion was lysed with Bovie cautery and some vesicouterine peritoneum adhesions were also lysed.  The Alexis skin retractor was placed carefully, making sure that no bowel was entrapped.  The vesicouterine peritoneum was identified  and the bladder flap was created digitally and sharply.  The uterus was incised in a transverse fashion.  The infant was delivered from a vertex presentation with the aid of a vacuum.  Nose and mouth were suctioned on the field.  Cord was clamped and  cut after waiting a minute.  The infant was then handed off to the waiting pediatric staff.  The placenta was expressed from the uterus.  Uterus was cleared of all clot and debris.  Uterine incision was closed with 2 layers of 0 Monocryl, the first of  which was a running locked and the second as an imbricating layer.  This was found to be hemostatic.  The Alexis retractor was then removed.  After inspection of the adnexa, the peritoneum was reapproximated using 2-0 Vicryl in a running fashion.  The  fascia was reapproximated with 0 Vicryl in a running suture.  After the subfascial planes were inspected and found to be hemostatic, the subcuticular adipose layer was made hemostatic with Bovie cautery and the dead space was closed with plain gut.  The  skin was closed with 4-0 Vicryl on a Keith needle.  Sponge, lap, and needle count was correct x2 per the operating staff.  Benzoin and Steri-Strips were applied.  The patient tolerated the procedure well.  VN/NUANCE  D:09/24/2019 T:09/24/2019 JOB:[id number]

## 2019-09-24 NOTE — Brief Op Note (Signed)
09/24/2019  9:39 AM  PATIENT:  Kristen Gomez  24 y.o. female  PRE-OPERATIVE DIAGNOSIS:  repeat c-section  POST-OPERATIVE DIAGNOSIS:  repeat c-section  PROCEDURE:  Procedure(s) with comments: CESAREAN SECTION (N/A) - Tracey RNFA  SURGEON:  Surgeon(s) and Role:    * Bovard-Stuckert, Damire Remedios, MD - Primary  ASSISTANTS: Genice Rouge, RNFA   ANESTHESIA:   spinal  EBL:  467cc IVF and uop per anesthesia  FINDINGS: viable female infant at 8:57am apgars P, weight P; nl tubes and ovaries.  Utero vesico, uteroperitoneal adhesions  BLOOD ADMINISTERED:none  DRAINS: Urinary Catheter (Foley)   LOCAL MEDICATIONS USED:  NONE  SPECIMEN:  Source of Specimen:  Placenta  DISPOSITION OF SPECIMEN:  L&D  COUNTS:  YES  TOURNIQUET:  * No tourniquets in log *  DICTATION: .Other Dictation: Dictation Number [id number]  PLAN OF CARE: Admit to inpatient   PATIENT DISPOSITION:  PACU - hemodynamically stable.   Delay start of Pharmacological VTE agent (>24hrs) due to surgical blood loss or risk of bleeding: not applicable

## 2019-09-24 NOTE — Progress Notes (Signed)
Patient ID: Kristen Gomez, female   DOB: 05-05-1995, 24 y.o.   MRN: [id number]  Day of Delivery - rLTCS   Pt had 50cc clot expressed after delivery In PACU had 300cc, small clots - given TXA  On MBU pt w more clots and bleeding (EBL to 800cc) BS evaluation, uterine exploration - evacuated several hundred cc clot Uterus firm at umbilicus.  Will give cytotec PR Also given fentanyl 25 Close monitoring, stat CBC

## 2019-09-25 LAB — CBC
HCT: 21.6 % — ABNORMAL LOW (ref 36.0–46.0)
HCT: 27.7 % — ABNORMAL LOW (ref 36.0–46.0)
Hemoglobin: 6.9 g/dL — CL (ref 12.0–15.0)
Hemoglobin: 9 g/dL — ABNORMAL LOW (ref 12.0–15.0)
MCH: 29.1 pg (ref 26.0–34.0)
MCH: 29 pg (ref 26.0–34.0)
MCHC: 31.9 g/dL (ref 30.0–36.0)
MCHC: 32.5 g/dL (ref 30.0–36.0)
MCV: 91.1 fL (ref 80.0–100.0)
MCV: 89.4 fL (ref 80.0–100.0)
Platelets: 110 10*3/uL — ABNORMAL LOW (ref 150–400)
Platelets: 128 10*3/uL — ABNORMAL LOW (ref 150–400)
RBC: 2.37 MIL/uL — ABNORMAL LOW (ref 3.87–5.11)
RBC: 3.1 MIL/uL — ABNORMAL LOW (ref 3.87–5.11)
RDW: 14.2 % (ref 11.5–15.5)
RDW: 14.5 % (ref 11.5–15.5)
WBC: 7.8 10*3/uL (ref 4.0–10.5)
WBC: 7.4 10*3/uL (ref 4.0–10.5)
nRBC: 0.5 % — ABNORMAL HIGH (ref 0.0–0.2)
nRBC: 0.7 % — ABNORMAL HIGH (ref 0.0–0.2)

## 2019-09-25 LAB — PREPARE RBC (CROSSMATCH)

## 2019-09-25 MED ORDER — WITCH HAZEL-GLYCERIN EX PADS: 1.0000 "application " | MEDICATED_PAD | CUTANEOUS | Status: DC | PRN

## 2019-09-25 MED ORDER — DIBUCAINE (PERIANAL) 1 % EX OINT: 1.0000 "application " | TOPICAL_OINTMENT | CUTANEOUS | Status: DC | PRN

## 2019-09-25 MED ORDER — SODIUM CHLORIDE 0.9% IV SOLUTION: Freq: Once | INTRAVENOUS | Status: AC

## 2019-09-25 NOTE — Progress Notes (Signed)
POSTPARTUM POSTOP PROGRESS NOTE  POD #1  Subjective:  No acute events overnight. RN called this AM with critical Hgb of 6.9, drop from 8 yesterday afternoon. Patient s/p Foley removal but has not sat up or ambulated. Pain moderately controlled, desires PO meds at this time. Endorses fatigue and some dizziness but denies feeling heavy lochia. Has not yet voided since Foley removal. Denies N/V. Baby starting to latch, milk not in yet.  Reviewed postop course as well as prior PPH meds administered plus IV Feraheme, reviewed possibility of pRBC trasnfusion  Objective: Blood pressure 100/74, pulse 67, temperature 98.9 F (37.2 C), temperature source Oral, resp. rate 16, height 4\' 9"  (1.448 m), weight 98 kg, last menstrual period 12/23/2018, SpO2 96 %, unknown if currently breastfeeding.  Physical Exam:  General: alert, cooperative and no distress Lochia:normal flow Chest: CTAB Heart: RRR no m/r/g Abdomen: +BS, soft, nontender Uterine Fundus: firm, 1cm below umbilicus and midline. Honeycomb dressing intact, no drainage. Pad with minimal lochia, no extrusion with funal massage Extremities: neg edema, neg calf TTP BL, neg Homans BL  Recent Labs    09/24/19 2016 09/25/19 0600  HGB 8.0* 6.9*  HCT 24.7* 21.6*    Assessment/Plan:  ASSESSMENT: Kristen Gomez is a 24 y.o. G3P3003 s/p RLTCS @ [redacted]w[redacted]d for h/o csx x2. PNC c/b RNI, obesity, +COVID on 8/27. Postop course notable for anemia 2/2 acute blood loss   1) Anemia 2/2 acute blood loss -S/p TXA x2, cytotec, IVE Feraheme x2 -Hgb now 6.9, reviewed RBA of pRBC transfusion with patient as she is symptomatically anemic. Will order 2u pRBC at this time  Otherwise routine postop orders. Monitor closely. Planning to breastfeed    LOS: 1 day

## 2019-09-25 NOTE — Progress Notes (Signed)
Notified Dr Elizbeth Squires about hgb 6.9, she is on her way to round.

## 2019-09-25 NOTE — Progress Notes (Signed)
Pt given MMR VIS. 

## 2019-09-25 NOTE — Progress Notes (Signed)
Chart review   H/H rose appropriately after 2u pRBC to 9/27.7 from 6.9/21.6. VSS BP 97-119/67-67, HR 72-87. Repeat Cbc ordered 0500 tomorrow. Continue to monitor

## 2019-09-26 LAB — BPAM RBC
Blood Product Expiration Date: 202110082359
Blood Product Expiration Date: 202110082359
ISSUE DATE / TIME: 202109091018
ISSUE DATE / TIME: 202109091246
Unit Type and Rh: 5100
Unit Type and Rh: 5100

## 2019-09-26 LAB — CBC
HCT: 27.9 % — ABNORMAL LOW (ref 36.0–46.0)
Hemoglobin: 9.1 g/dL — ABNORMAL LOW (ref 12.0–15.0)
MCH: 29 pg (ref 26.0–34.0)
MCHC: 32.6 g/dL (ref 30.0–36.0)
MCV: 88.9 fL (ref 80.0–100.0)
Platelets: 151 10*3/uL (ref 150–400)
RBC: 3.14 MIL/uL — ABNORMAL LOW (ref 3.87–5.11)
RDW: 14.7 % (ref 11.5–15.5)
WBC: 7.6 10*3/uL (ref 4.0–10.5)
nRBC: 0.7 % — ABNORMAL HIGH (ref 0.0–0.2)

## 2019-09-26 LAB — TYPE AND SCREEN
ABO/RH(D): O POS
Antibody Screen: NEGATIVE
Unit division: 0
Unit division: 0

## 2019-09-26 NOTE — Lactation Note (Signed)
This note was copied from a baby's chart. Lactation Consultation Note Baby 9 hrs old. Mom starting to give formula when LC entered room.  Asked mom if she was BF any, mom stated she just finished BF and now she is giving formula. She is doing both. Asked mom if she had any questions about BF or needed any assistance. Mom stated No that this was her 3rd baby and she pretty much knew what she was doing. Congratulated mom on the birth. Lactation is no longer needed.  Patient Name: Kristen Gomez [id number] Date: 09/26/2019 Reason for consult: Follow-up assessment;Term   Maternal Data    Feeding Feeding Type: Bottle Fed - Formula Nipple Type: Slow - flow  LATCH Score                   Interventions    Lactation Tools Discussed/Used     Consult Status Consult Status: Complete Date: 09/26/19    Charyl Dancer 09/26/2019, 3:12 AM

## 2019-09-26 NOTE — Progress Notes (Signed)
POD #2 LTCS Doing well, feels better, has not ambulated yet Afeb, VSS Abd- soft, fundus firm, pressure dressing intact Hgb 9.1  Continue routine care, encouraged ambulation, remove pressure dressing

## 2019-09-27 MED ORDER — OXYCODONE HCL 5 MG PO TABS
5.0000 mg | ORAL_TABLET | ORAL | 0 refills | Status: DC | PRN
Start: 2019-09-27 — End: 2020-11-02

## 2019-09-27 MED ORDER — IBUPROFEN 800 MG PO TABS: 800.0000 mg | ORAL_TABLET | Freq: Three times a day (TID) | ORAL | 0 refills | Status: DC | PRN

## 2019-09-27 MED ORDER — IBUPROFEN 800 MG PO TABS
800.0000 mg | ORAL_TABLET | Freq: Three times a day (TID) | ORAL | Status: DC | PRN
  Administered 2019-09-27: 800 mg via ORAL
  Filled 2019-09-27: qty 1

## 2019-09-27 NOTE — Discharge Instructions (Signed)
As per discharge pamphlet °

## 2019-09-27 NOTE — Progress Notes (Signed)
POD #3 LTCS Doing well, no problems Afeb, VSS Abd- soft, fundus firm, incision intact D/c home

## 2019-09-27 NOTE — Discharge Summary (Signed)
Postpartum Discharge Summary      Patient Name: Kristen Gomez DOB: 1995/07/03 MRN: [id number]  Date of admission: 09/24/2019 Delivery date:09/24/2019  Delivering provider: Sherian Rein  Date of discharge: 09/27/2019  Admitting diagnosis: Status post repeat low transverse cesarean section [Z98.891] Intrauterine pregnancy: [redacted]w[redacted]d     Secondary diagnosis:  Active Problems:   Status post repeat low transverse cesarean section    Discharge diagnosis: Term Pregnancy Delivered and [facility]            Hospital course: Sceduled C/S   24 y.o. yo G3P3003 at [redacted]w[redacted]d was admitted to the hospital 09/24/2019 for scheduled cesarean section with the following indication:Elective Repeat.Delivery details are as follows:  Membrane Rupture Time/Date: 8:56 AM ,09/24/2019   Delivery Method:C-Section, Low Transverse  Details of operation can be found in separate operative note.  She received TXA and IV fluids for PPH, then received 2 units PRBC for resulting anemia.  She is ambulating, tolerating a regular diet, passing flatus, and urinating well. Patient is discharged home in stable condition on  09/27/19        Newborn Data: Birth date:09/24/2019  Birth time:8:57 AM  Gender:Female  Living status:Living  Apgars:8 ,9  Weight:4088 g      Transfusion:Yes  Physical exam  Vitals:   09/26/19 0621 09/26/19 1329 09/26/19 2116 09/27/19 0500  BP: 105/78 123/85 113/86 120/74  Pulse: 76 82 87 92  Resp: 18 16 18 17   Temp: 98 F (36.7 C) 98.4 F (36.9 C) 98.1 F (36.7 C) 97.8 F (36.6 C)  TempSrc: Oral Axillary Oral Oral  SpO2: 99%  98%   Weight:      Height:       General: alert Lochia: appropriate Uterine Fundus: firm Incision: Healing well with no significant drainage  Labs: Lab Results  Component Value Date   WBC 7.6 09/26/2019   HGB 9.1 (L) 09/26/2019   HCT 27.9 (L) 09/26/2019   MCV 88.9 09/26/2019   PLT 151 09/26/2019   CMP Latest Ref Rng & Units 09/24/2019  Glucose 70 - 99 mg/dL -   BUN 6 - 20 mg/dL -  Creatinine 11/24/2019 - 7.37 mg/dL 1.06  Sodium 2.69 - 485 mmol/L -  Potassium 3.5 - 5.1 mmol/L -  Chloride 98 - 111 mmol/L -  CO2 22 - 32 mmol/L -  Calcium 8.9 - 10.3 mg/dL -  Total Protein 6.5 - 8.1 g/dL -  Total Bilirubin 0.3 - 1.2 mg/dL -  Alkaline Phos 38 - 462 U/L -  AST 15 - 41 U/L -  ALT 0 - 44 U/L -   Edinburgh Score: Edinburgh Postnatal Depression Scale Screening Tool 09/25/2019  I have been able to laugh and see the funny side of things. 0  I have looked forward with enjoyment to things. 0  I have blamed myself unnecessarily when things went wrong. 0  I have been anxious or worried for no good reason. 0  I have felt scared or panicky for no good reason. 0  Things have been getting on top of me. 0  I have been so unhappy that I have had difficulty sleeping. 0  I have felt sad or miserable. 0  I have been so unhappy that I have been crying. 0  The thought of harming myself has occurred to me. 0  Edinburgh Postnatal Depression Scale Total 0      After visit meds:  Allergies as of 09/27/2019   No Known Allergies  Medication List    STOP taking these medications   ELDERBERRY PO     TAKE these medications   acetaminophen 500 MG tablet Commonly known as: TYLENOL Take 1,000 mg by mouth every 6 (six) hours as needed for moderate pain or headache.   B-12 PO Take 1 tablet by mouth daily.   ibuprofen 800 MG tablet Commonly known as: ADVIL Take 1 tablet (800 mg total) by mouth every 8 (eight) hours as needed for moderate pain.   oxyCODONE 5 MG immediate release tablet Commonly known as: Oxy IR/ROXICODONE Take 1 tablet (5 mg total) by mouth every 4 (four) hours as needed for severe pain.   PNV Tabs 29-1 29-1 MG Tabs Take 1 tablet by mouth daily.        Discharge home in stable condition Infant Feeding: Bottle Infant Disposition:home with mother Discharge instruction: per After Visit Summary and Postpartum booklet. Activity: Advance as  tolerated. Pelvic rest for 6 weeks.  Diet: routine diet  Postpartum Appointment:2 weeks  Follow up Visit:  Follow-up Information    Bovard-Stuckert, Jody, MD. Schedule an appointment as soon as possible for a visit in 2 week(s).   Specialty: Obstetrics and Gynecology Contact information: [address] [place] [address] [phone]                   09/27/2019 Zenaida Niece, MD

## 2020-01-12 ENCOUNTER — Ambulatory Visit (HOSPITAL_COMMUNITY)
Admission: EM | Admit: 2020-01-12 | Discharge: 2020-01-12 | Disposition: A | Discharge status: Home / Self Care | Payer: Medicaid Other | Attending: Student | Admitting: Student

## 2020-01-12 ENCOUNTER — Encounter (HOSPITAL_COMMUNITY): Payer: Self-pay

## 2020-01-12 ENCOUNTER — Ambulatory Visit (INDEPENDENT_AMBULATORY_CARE_PROVIDER_SITE_OTHER): Payer: Medicaid Other

## 2020-01-12 ENCOUNTER — Other Ambulatory Visit: Payer: Self-pay

## 2020-01-12 DIAGNOSIS — M25461 Effusion, right knee: Secondary | ICD-10-CM | POA: Diagnosis not present

## 2020-01-12 DIAGNOSIS — M25561 Pain in right knee: Secondary | ICD-10-CM | POA: Diagnosis not present

## 2020-01-12 DIAGNOSIS — J069 Acute upper respiratory infection, unspecified: Secondary | ICD-10-CM | POA: Diagnosis not present

## 2020-01-12 DIAGNOSIS — Z20822 Contact with and (suspected) exposure to covid-19: Secondary | ICD-10-CM | POA: Diagnosis not present

## 2020-01-12 LAB — RESP PANEL BY RT-PCR (FLU A&B, COVID) ARPGX2
Influenza A by PCR: NEGATIVE
Influenza B by PCR: NEGATIVE
SARS Coronavirus 2 by RT PCR: NEGATIVE

## 2020-01-12 LAB — POCT RAPID STREP A, ED / UC: Streptococcus, Group A Screen (Direct): NEGATIVE

## 2020-01-12 NOTE — ED Provider Notes (Signed)
[facility]    CSN: [id number] Arrival date & time: 01/12/20  4098      History   Chief Complaint Chief Complaint  Patient presents with  . Sore Throat  . Knee Pain    HPI Kristen Gomez is a 24 y.o. female presenting for sore throat for few days and right knee pain. History of anxiety, UTI, headaches.  -Sore throat: states her husband has strep right now and she is concerned she has it too. Endorses sore throat for 2 days. Nonproductive cough. Denies fevers/chills, n/v/d, shortness of breath, chest pain, congestion, facial pain, teeth pain, headaches,loss of taste/smell, swollen lymph nodes, ear pain.  -Knee pain: was getting out of car 1 week ago and thinks she twisted her right knee. Immediately felt pain. Knee has been slightly swollen and painful with movement/walking since then. Denies sensation changes, weakness, pain in other joints.   HPI  Past Medical History:  Diagnosis Date  . Anxiety   . Headache   . History of cesarean section, low transverse 12/28/2017  . S/P cesarean section 09/30/2014  . UTI (urinary tract infection)     Patient Active Problem List   Diagnosis Date Noted  . Status post repeat low transverse cesarean section 12/31/2017  . History of cesarean section, low transverse 12/28/2017  . S/P cesarean section 09/30/2014  . Labor and delivery, indication for care 09/29/2014    Past Surgical History:  Procedure Laterality Date  . CESAREAN SECTION N/A 09/30/2014   Procedure: CESAREAN SECTION;  Surgeon: Sherian Rein, MD;  Location: [facility];  Service: Obstetrics;  Laterality: N/A;  . CESAREAN SECTION N/A 12/31/2017   Procedure: REPEAT CESAREAN SECTION;  Surgeon: Sherian Rein, MD;  Location: [facility];  Service: Obstetrics;  Laterality: N/A;  [name],  RNFA  . CESAREAN SECTION N/A 09/24/2019   Procedure: CESAREAN SECTION;  Surgeon: Sherian Rein, MD;  Location: [facility] LD [facility]S;  Service: Obstetrics;   Laterality: N/A;  Tracey RNFA  . TONSILLECTOMY      OB History    Gravida  3   Para  3   Term  3   Preterm      AB      Living  3     SAB      IAB      Ectopic      Multiple  0   Live Births  3            Home Medications    Prior to Admission medications   Medication Sig Start Date End Date Taking? Authorizing Provider  acetaminophen (TYLENOL) 500 MG tablet Take 1,000 mg by mouth every 6 (six) hours as needed for moderate pain or headache.    [provider]  Cyanocobalamin (B-12 PO) Take 1 tablet by mouth daily.    [provider]  ibuprofen (ADVIL) 800 MG tablet Take 1 tablet (800 mg total) by mouth every 8 (eight) hours as needed for moderate pain. 09/27/19   Meisinger, Tawanna Cooler, MD  oxyCODONE (OXY IR/ROXICODONE) 5 MG immediate release tablet Take 1 tablet (5 mg total) by mouth every 4 (four) hours as needed for severe pain. 09/27/19   Meisinger, Tawanna Cooler, MD  Prenatal Vit-Iron Carbonyl-FA (PNV TABS 29-1) 29-1 MG TABS Take 1 tablet by mouth daily. 06/23/19   [provider]    Family History Family History  Problem Relation Age of Onset  . Healthy Mother   . Healthy Father     Social History Social  History   Tobacco Use  . Smoking status: Never Smoker  . Smokeless tobacco: Never Used  Vaping Use  . Vaping Use: Never used  Substance Use Topics  . Alcohol use: No  . Drug use: No     Allergies   Patient has no known allergies.   Review of Systems Review of Systems  HENT: Positive for sore throat.   Respiratory: Positive for cough.   Musculoskeletal:       Right knee pain  All other systems reviewed and are negative.    Physical Exam Triage Vital Signs ED Triage Vitals  Enc Vitals Group     BP 01/12/20 1023 106/75     Pulse Rate 01/12/20 1023 72     Resp 01/12/20 1023 20     Temp 01/12/20 1023 98.4 F (36.9 C)     Temp Source 01/12/20 1023 Oral     SpO2 01/12/20 1023 97 %     Weight --      Height --      Head  Circumference --      Peak Flow --      Pain Score 01/12/20 1019 7     Pain Loc --      Pain Edu? --      Excl. in GC? --    No data found.  Updated Vital Signs BP 106/75 (BP Location: Right Arm)   Pulse 72   Temp 98.4 F (36.9 C) (Oral)   Resp 20   LMP 01/11/2020   SpO2 97%   Breastfeeding No   Visual Acuity Right Eye Distance:   Left Eye Distance:   Bilateral Distance:    Right Eye Near:   Left Eye Near:    Bilateral Near:     Physical Exam Vitals reviewed.  Constitutional:      General: She is not in acute distress.    Appearance: Normal appearance. She is well-developed. She is not ill-appearing.  HENT:     Head: Normocephalic and atraumatic.     Right Ear: Hearing, tympanic membrane, ear canal and external ear normal. No swelling or tenderness. There is no impacted cerumen. No mastoid tenderness. Tympanic membrane is not perforated, erythematous, retracted or bulging.     Left Ear: Hearing, tympanic membrane, ear canal and external ear normal. No swelling or tenderness. There is no impacted cerumen. No mastoid tenderness. Tympanic membrane is not perforated, erythematous, retracted or bulging.     Nose:     Right Sinus: No maxillary sinus tenderness or frontal sinus tenderness.     Left Sinus: No maxillary sinus tenderness or frontal sinus tenderness.     Mouth/Throat:     Mouth: Mucous membranes are moist.     Pharynx: Oropharynx is clear. Uvula midline. Posterior oropharyngeal erythema present. No oropharyngeal exudate.     Tonsils: No tonsillar exudate or tonsillar abscesses.     Comments: Posterior pharynx is Mildly erythematous but no exudate, tonsillar enlargement. Uvula midline palate raises symmetrically  Cardiovascular:     Rate and Rhythm: Normal rate and regular rhythm.     Heart sounds: Normal heart sounds.  Pulmonary:     Breath sounds: Normal breath sounds and air entry.  Musculoskeletal:     Comments: R knee mildly swollen. Tenderness to  palpation along lateral patellar tendon. Tenderness with flexion of R knee. Gait normal. Questionably positive McMurray test.   Lymphadenopathy:     Cervical: No cervical adenopathy.  Neurological:     General: No  focal deficit present.     Mental Status: She is alert and oriented to person, place, and time.  Psychiatric:        Attention and Perception: Attention and perception normal.        Mood and Affect: Mood and affect normal.        Behavior: Behavior normal. Behavior is cooperative.      UC Treatments / Results  Labs (all labs ordered are listed, but only abnormal results are displayed) Labs Reviewed  RESP PANEL BY RT-PCR (FLU A&B, COVID) [id number]  CULTURE, GROUP A STREP [facility])  POCT RAPID STREP A, ED / UC    EKG   Radiology DG Knee Complete 4 Views Right  Result Date: 01/12/2020 CLINICAL DATA:  Knee pain and swelling, pain with weight-bearing EXAM: RIGHT KNEE - COMPLETE 4+ VIEW COMPARISON:  None FINDINGS: No sign of fracture or dislocation. No sign of joint effusion. Soft tissues are unremarkable. IMPRESSION: Negative evaluation of the RIGHT knee. Electronically Signed   By: Donzetta Kohut M.D.   On: 01/12/2020 11:46    Procedures Procedures (including critical care time)  Medications Ordered in UC Medications - No data to display  Initial Impression / Assessment and Plan / UC Course  I have reviewed the triage vital signs and the nursing notes.  Pertinent labs & imaging results that were available during my care of the patient were reviewed by me and considered in my medical decision making (see chart for details).     Pt has not been vaccinated for covid but has been diagnosed with covid in the past. Her husband is currently being treated for strep per pt. Rapid strep test negative today; culture sent. Pt is not currently breastfeeding and is not pregnant.  Covid and influenza tests sent today. Patient is not vaccinated for covid-19. Isolation precautions per  CDC guidelines until negative result. Symptomatic relief with OTC Mucinex, Nyquil, etc. Return precautions- new/worsening fevers/chills, shortness of breath, chest pain, abd pain, etc.   For knee pain, xray today showing no sign of fracture or dislocation. No sign of joint effusion. Soft tissues are unremarkable. Negative evaluation of the RIGHT knee. Reassurance provided. Brace applied today. Continue tylenol/ibuprofen, RICE. Sports medicine information provided; pt will schedule appt in 1-2 weeks if symptoms persist.    Final Clinical Impressions(s) / UC Diagnoses   Final diagnoses:  Acute pain of right knee  Acute upper respiratory infection   Discharge Instructions   None    ED Prescriptions    None     PDMP not reviewed this encounter.   Rhys Martini, PA-C 01/12/20 1229

## 2020-01-12 NOTE — Discharge Instructions (Addendum)
Continue Tylenol and Ibuprofen for your knee pain. Wear brace during the day for 1-2 weeks. You can make an appointment with the sports specialist if you continue to have pain despite this treatment plan.

## 2020-01-12 NOTE — ED Triage Notes (Signed)
Pt in with c/o ST that has been going on for a few days now. States her partner just recently had strep.  Also c/o right knee pain that has been going on for a few days. States she doesn't know what she did to it but it hurts to put weight on it.

## 2020-01-14 LAB — CULTURE, GROUP A STREP (THRC)

## 2020-01-15 ENCOUNTER — Telehealth (HOSPITAL_COMMUNITY): Payer: Self-pay | Admitting: Emergency Medicine

## 2020-01-15 MED ORDER — PENICILLIN V POTASSIUM 500 MG PO TABS: 500.0000 mg | ORAL_TABLET | Freq: Two times a day (BID) | ORAL | 0 refills | Status: AC

## 2020-01-24 ENCOUNTER — Ambulatory Visit (HOSPITAL_COMMUNITY)
Admission: EM | Admit: 2020-01-24 | Discharge: 2020-01-24 | Disposition: A | Discharge status: Home / Self Care | Payer: Medicaid Other | Attending: Physician Assistant | Admitting: Physician Assistant

## 2020-01-24 ENCOUNTER — Other Ambulatory Visit: Payer: Self-pay

## 2020-01-24 ENCOUNTER — Encounter (HOSPITAL_COMMUNITY): Payer: Self-pay | Admitting: Emergency Medicine

## 2020-01-24 DIAGNOSIS — J069 Acute upper respiratory infection, unspecified: Secondary | ICD-10-CM

## 2020-01-24 MED ORDER — BENZONATATE 100 MG PO CAPS: 100.0000 mg | ORAL_CAPSULE | Freq: Four times a day (QID) | ORAL | 0 refills | Status: DC | PRN

## 2020-01-24 NOTE — ED Provider Notes (Signed)
[facility]    CSN: [id number] Arrival date & time: 01/24/20  1034      History   Chief Complaint Chief Complaint  Patient presents with  . Cough    HPI Kristen Gomez is a 25 y.o. female.   Pt complains of ear soreness and cough.   The history is provided by the patient. No language interpreter was used.  Cough Cough characteristics:  Non-productive Sputum characteristics:  Nondescript Severity:  Moderate Onset quality:  Gradual Timing:  Constant Worsened by:  Nothing Ineffective treatments:  None tried Associated symptoms: ear fullness     Past Medical History:  Diagnosis Date  . Anxiety   . Headache   . History of cesarean section, low transverse 12/28/2017  . S/P cesarean section 09/30/2014  . UTI (urinary tract infection)     Patient Active Problem List   Diagnosis Date Noted  . Status post repeat low transverse cesarean section 12/31/2017  . History of cesarean section, low transverse 12/28/2017  . S/P cesarean section 09/30/2014  . Labor and delivery, indication for care 09/29/2014    Past Surgical History:  Procedure Laterality Date  . CESAREAN SECTION N/A 09/30/2014   Procedure: CESAREAN SECTION;  Surgeon: Sherian Rein, MD;  Location: [facility];  Service: Obstetrics;  Laterality: N/A;  . CESAREAN SECTION N/A 12/31/2017   Procedure: REPEAT CESAREAN SECTION;  Surgeon: Sherian Rein, MD;  Location: [facility];  Service: Obstetrics;  Laterality: N/A;  [name],  RNFA  . CESAREAN SECTION N/A 09/24/2019   Procedure: CESAREAN SECTION;  Surgeon: Sherian Rein, MD;  Location: [facility];  Service: Obstetrics;  Laterality: N/A;  Tracey RNFA  . TONSILLECTOMY      OB History    Gravida  3   Para  3   Term  3   Preterm      AB      Living  3     SAB      IAB      Ectopic      Multiple  0   Live Births  3            Home Medications    Prior to Admission medications   Medication Sig Start  Date End Date Taking? Authorizing Provider  benzonatate (TESSALON PERLES) 100 MG capsule Take 1 capsule (100 mg total) by mouth every 6 (six) hours as needed for cough. 01/24/20 01/23/21 Yes Elson Areas, PA-C  acetaminophen (TYLENOL) 500 MG tablet Take 1,000 mg by mouth every 6 (six) hours as needed for moderate pain or headache.    [provider]  Cyanocobalamin (B-12 PO) Take 1 tablet by mouth daily.    [provider]  ibuprofen (ADVIL) 800 MG tablet Take 1 tablet (800 mg total) by mouth every 8 (eight) hours as needed for moderate pain. 09/27/19   Meisinger, Tawanna Cooler, MD  oxyCODONE (OXY IR/ROXICODONE) 5 MG immediate release tablet Take 1 tablet (5 mg total) by mouth every 4 (four) hours as needed for severe pain. 09/27/19   Meisinger, Tawanna Cooler, MD  penicillin v potassium (VEETID) 500 MG tablet Take 1 tablet (500 mg total) by mouth in the morning and at bedtime for 10 days. 01/15/20 01/25/20  LampteyBritta Mccreedy, MD  Prenatal Vit-Iron Carbonyl-FA (PNV TABS 29-1) 29-1 MG TABS Take 1 tablet by mouth daily. 06/23/19   [provider]    Family History Family History  Problem Relation Age of Onset  . Healthy Mother   .  Healthy Father     Social History Social History   Tobacco Use  . Smoking status: Never Smoker  . Smokeless tobacco: Never Used  Vaping Use  . Vaping Use: Never used  Substance Use Topics  . Alcohol use: No  . Drug use: No     Allergies   Patient has no known allergies.   Review of Systems Review of Systems  Respiratory: Positive for cough.   All other systems reviewed and are negative.    Physical Exam Triage Vital Signs ED Triage Vitals  Enc Vitals Group     BP 01/24/20 1134 110/70     Pulse Rate 01/24/20 1134 85     Resp 01/24/20 1134 16     Temp 01/24/20 1134 98.8 F (37.1 C)     Temp Source 01/24/20 1134 Oral     SpO2 01/24/20 1134 100 %     Weight 01/24/20 1133 179 lb (81.2 kg)     Height 01/24/20 1133 4\' 9"  (1.448 m)     Head  Circumference --      Peak Flow --      Pain Score 01/24/20 1132 6     Pain Loc --      Pain Edu? --      Excl. in GC? --    No data found.  Updated Vital Signs BP 110/70 (BP Location: Right Arm)   Pulse 85   Temp 98.8 F (37.1 C) (Oral)   Resp 16   Ht 4\' 9"  (1.448 m)   Wt 81.2 kg   LMP 01/11/2020   SpO2 100%   BMI 38.74 kg/m   Visual Acuity Right Eye Distance:   Left Eye Distance:   Bilateral Distance:    Right Eye Near:   Left Eye Near:    Bilateral Near:     Physical Exam Vitals and nursing note reviewed.  Constitutional:      Appearance: She is well-developed and well-nourished.  HENT:     Head: Normocephalic.  Eyes:     Extraocular Movements: EOM normal.  Cardiovascular:     Rate and Rhythm: Normal rate.  Pulmonary:     Effort: Pulmonary effort is normal.  Abdominal:     General: There is no distension.  Musculoskeletal:        General: Normal range of motion.     Cervical back: Normal range of motion.  Skin:    General: Skin is warm.  Neurological:     General: No focal deficit present.     Mental Status: She is alert and oriented to person, place, and time.  Psychiatric:        Mood and Affect: Mood and affect and mood normal.      UC Treatments / Results  Labs (all labs ordered are listed, but only abnormal results are displayed) Labs Reviewed - No data to display  EKG   Radiology No results found.  Procedures Procedures (including critical care time)  Medications Ordered in UC Medications - No data to display  Initial Impression / Assessment and Plan / UC Course  I have reviewed the triage vital signs and the nursing notes.  Pertinent labs & imaging results that were available during my care of the patient were reviewed by me and considered in my medical decision making (see chart for details).     MDM:  Pt looks good.  Pt given rx for tessalon.  Final Clinical Impressions(s) / UC Diagnoses   Final diagnoses:  Viral URI  with cough     Discharge Instructions     Return if any problems.    ED Prescriptions    Medication Sig Dispense Auth. Provider   benzonatate (TESSALON PERLES) 100 MG capsule Take 1 capsule (100 mg total) by mouth every 6 (six) hours as needed for cough. 30 capsule Elson Areas, [place]     PDMP not reviewed this encounter.  An After Visit Summary was printed and given to the patient.    Elson Areas, [place] 01/24/20 1429

## 2020-01-24 NOTE — ED Triage Notes (Signed)
Pt having dry cough x few days was tested for Covid and negative. Pt having right ear pain. No fevers.

## 2020-01-24 NOTE — Discharge Instructions (Addendum)
Return if any problems.

## 2020-11-02 ENCOUNTER — Telehealth: Payer: Medicaid Other | Admitting: Physician Assistant

## 2020-11-02 DIAGNOSIS — J019 Acute sinusitis, unspecified: Secondary | ICD-10-CM | POA: Diagnosis not present

## 2020-11-02 DIAGNOSIS — B9689 Other specified bacterial agents as the cause of diseases classified elsewhere: Secondary | ICD-10-CM

## 2020-11-02 MED ORDER — FLUTICASONE PROPIONATE 50 MCG/ACT NA SUSP: 2.0000 | Freq: Every day | NASAL | 0 refills | Status: AC

## 2020-11-02 MED ORDER — PREDNISONE 20 MG PO TABS: 40.0000 mg | ORAL_TABLET | Freq: Every day | ORAL | 0 refills | Status: DC

## 2020-11-02 MED ORDER — AMOXICILLIN-POT CLAVULANATE 875-125 MG PO TABS: 1.0000 | ORAL_TABLET | Freq: Two times a day (BID) | ORAL | 0 refills | Status: DC

## 2020-11-02 NOTE — Progress Notes (Signed)
Virtual Visit Consent   Kristen Gomez, you are scheduled for a virtual visit with a [facility] provider today.     Just as with appointments in the office, your consent must be obtained to participate.  Your consent will be active for this visit and any virtual visit you may have with one of our providers in the next 365 days.     If you have a MyChart account, a copy of this consent can be sent to you electronically.  All virtual visits are billed to your insurance company just like a traditional visit in the office.    As this is a virtual visit, video technology does not allow for your provider to perform a traditional examination.  This may limit your provider's ability to fully assess your condition.  If your provider identifies any concerns that need to be evaluated in person or the need to arrange testing (such as labs, EKG, etc.), we will make arrangements to do so.     Although advances in technology are sophisticated, we cannot ensure that it will always work on either your end or our end.  If the connection with a video visit is poor, the visit may have to be switched to a telephone visit.  With either a video or telephone visit, we are not always able to ensure that we have a secure connection.     I need to obtain your verbal consent now.   Are you willing to proceed with your visit today?    Kristen Gomez has provided verbal consent on 11/02/2020 for a virtual visit (video or telephone).   [name], [place]   Date: 11/02/2020 11:50 AM   Virtual Visit via Video Note   I, [name], connected with  Kristen Gomez  ([id number], 11-29-1995) on 11/02/20 at 11:45 AM EDT by a video-enabled telemedicine application and verified that I am speaking with the correct person using two identifiers.  Location: Patient: Virtual Visit Location Patient: Home Provider: Virtual Visit Location Provider: Home Office   I discussed the limitations of  evaluation and management by telemedicine and the availability of in person appointments. The patient expressed understanding and agreed to proceed.    History of Present Illness: Kristen Gomez is a 25 y.o. who identifies as a female who was assigned female at birth, and is being seen today for ongoing frontal headache over the past several days. Notes has been preceded by a couple of weeks of URI symptoms that have mostly resolved but still left with nasal and head congestion. Denies fever, chills. Denies maxillary pain but noted frontal tenderness. Denies vision changes. Has taken OTC pain relievers without resolution of symptoms.  HPI: HPI  Problems:  Patient Active Problem List   Diagnosis Date Noted   Status post repeat low transverse cesarean section 12/31/2017   History of cesarean section, low transverse 12/28/2017   S/P cesarean section 09/30/2014   Labor and delivery, indication for care 09/29/2014    Allergies: No Known Allergies Medications:  Current Outpatient Medications:    amoxicillin-clavulanate (AUGMENTIN) 875-125 MG tablet, Take 1 tablet by mouth 2 (two) times daily., Disp: 14 tablet, Rfl: 0   fluticasone (FLONASE) 50 MCG/ACT nasal spray, Place 2 sprays into both nostrils daily., Disp: 16 g, Rfl: 0   predniSONE (DELTASONE) 20 MG tablet, Take 2 tablets (40 mg total) by mouth daily with breakfast., Disp: 6 tablet, Rfl: 0   levonorgestrel-ethinyl estradiol (AVIANE) 0.1-20 MG-MCG tablet, Aviane  0.1 mg-20 mcg tablet  TAKE 1 TABLET BY MOUTH ONCE DAILY, Disp: , Rfl:    Prenatal Vit-Iron Carbonyl-FA (PNV TABS 29-1) 29-1 MG TABS, Take 1 tablet by mouth daily., Disp: , Rfl:   Observations/Objective: Patient is well-developed, well-nourished in no acute distress.  Resting comfortably at home.  Head is normocephalic, atraumatic.  No labored breathing. Speech is clear and coherent with logical content.  Patient is alert and oriented at baseline.   Assessment and Plan: 1.  Acute bacterial sinusitis - fluticasone (FLONASE) 50 MCG/ACT nasal spray; Place 2 sprays into both nostrils daily.  Dispense: 16 g; Refill: 0 - amoxicillin-clavulanate (AUGMENTIN) 875-125 MG tablet; Take 1 tablet by mouth 2 (two) times daily.  Dispense: 14 tablet; Refill: 0 - predniSONE (DELTASONE) 20 MG tablet; Take 2 tablets (40 mg total) by mouth daily with breakfast.  Dispense: 6 tablet; Refill: 0 Rx Augmentin.  Increase fluids.  Rest.  Saline nasal spray.  Probiotic.  Mucinex as directed.  Humidifier in bedroom. Flonase and Prednisone per orders.  Call or return to clinic if symptoms are not improving.   Follow Up Instructions: I discussed the assessment and treatment plan with the patient. The patient was provided an opportunity to ask questions and all were answered. The patient agreed with the plan and demonstrated an understanding of the instructions.  A copy of instructions were sent to the patient via MyChart unless otherwise noted below.   The patient was advised to call back or seek an in-person evaluation if the symptoms worsen or if the condition fails to improve as anticipated.  Time:  I spent 10 minutes with the patient via telehealth technology discussing the above problems/concerns.    [name], PA-C

## 2020-11-02 NOTE — Patient Instructions (Signed)
Jeanann Glenna Durand, thank you for joining [name], PA-C for today's virtual visit.  While this provider is not your primary care provider (PCP), if your PCP is located in our provider database this encounter information will be shared with them immediately following your visit.  Consent: (Patient) Kristen Gomez provided verbal consent for this virtual visit at the beginning of the encounter.  Current Medications:  Current Outpatient Medications:    acetaminophen (TYLENOL) 500 MG tablet, Take 1,000 mg by mouth every 6 (six) hours as needed for moderate pain or headache., Disp: , Rfl:    benzonatate (TESSALON PERLES) 100 MG capsule, Take 1 capsule (100 mg total) by mouth every 6 (six) hours as needed for cough., Disp: 30 capsule, Rfl: 0   Cyanocobalamin (B-12 PO), Take 1 tablet by mouth daily., Disp: , Rfl:    ibuprofen (ADVIL) 800 MG tablet, Take 1 tablet (800 mg total) by mouth every 8 (eight) hours as needed for moderate pain., Disp: 30 tablet, Rfl: 0   oxyCODONE (OXY IR/ROXICODONE) 5 MG immediate release tablet, Take 1 tablet (5 mg total) by mouth every 4 (four) hours as needed for severe pain., Disp: 10 tablet, Rfl: 0   Prenatal Vit-Iron Carbonyl-FA (PNV TABS 29-1) 29-1 MG TABS, Take 1 tablet by mouth daily., Disp: , Rfl:    Medications ordered in this encounter:  No orders of the defined types were placed in this encounter.    *If you need refills on other medications prior to your next appointment, please contact your pharmacy*  Follow-Up: Call back or seek an in-person evaluation if the symptoms worsen or if the condition fails to improve as anticipated.  Other Instructions Please take antibiotic as directed.  Increase fluid intake.  Use Saline nasal spray.  Take a daily multivitamin. Use the prednisone and Flonase as directed.  Place a humidifier in the bedroom.  Please call or return clinic if symptoms are not improving.  Sinusitis Sinusitis is redness,  soreness, and swelling (inflammation) of the paranasal sinuses. Paranasal sinuses are air pockets within the bones of your face (beneath the eyes, the middle of the forehead, or above the eyes). In healthy paranasal sinuses, mucus is able to drain out, and air is able to circulate through them by way of your nose. However, when your paranasal sinuses are inflamed, mucus and air can become trapped. This can allow bacteria and other germs to grow and cause infection. Sinusitis can develop quickly and last only a short time (acute) or continue over a long period (chronic). Sinusitis that lasts for more than 12 weeks is considered chronic.  CAUSES  Causes of sinusitis include: Allergies. Structural abnormalities, such as displacement of the cartilage that separates your nostrils (deviated septum), which can decrease the air flow through your nose and sinuses and affect sinus drainage. Functional abnormalities, such as when the small hairs (cilia) that line your sinuses and help remove mucus do not work properly or are not present. SYMPTOMS  Symptoms of acute and chronic sinusitis are the same. The primary symptoms are pain and pressure around the affected sinuses. Other symptoms include: Upper toothache. Earache. Headache. Bad breath. Decreased sense of smell and taste. A cough, which worsens when you are lying flat. Fatigue. Fever. Thick drainage from your nose, which often is green and may contain pus (purulent). Swelling and warmth over the affected sinuses. DIAGNOSIS  Your caregiver will perform a physical exam. During the exam, your caregiver may: Look in your nose for signs  of abnormal growths in your nostrils (nasal polyps). Tap over the affected sinus to check for signs of infection. View the inside of your sinuses (endoscopy) with a special imaging device with a light attached (endoscope), which is inserted into your sinuses. If your caregiver suspects that you have chronic sinusitis,  one or more of the following tests may be recommended: Allergy tests. Nasal culture A sample of mucus is taken from your nose and sent to a lab and screened for bacteria. Nasal cytology A sample of mucus is taken from your nose and examined by your caregiver to determine if your sinusitis is related to an allergy. TREATMENT  Most cases of acute sinusitis are related to a viral infection and will resolve on their own within 10 days. Sometimes medicines are prescribed to help relieve symptoms (pain medicine, decongestants, nasal steroid sprays, or saline sprays).  However, for sinusitis related to a bacterial infection, your caregiver will prescribe antibiotic medicines. These are medicines that will help kill the bacteria causing the infection.  Rarely, sinusitis is caused by a fungal infection. In theses cases, your caregiver will prescribe antifungal medicine. For some cases of chronic sinusitis, surgery is needed. Generally, these are cases in which sinusitis recurs more than 3 times per year, despite other treatments. HOME CARE INSTRUCTIONS  Drink plenty of water. Water helps thin the mucus so your sinuses can drain more easily. Use a humidifier. Inhale steam 3 to 4 times a day (for example, sit in the bathroom with the shower running). Apply a warm, moist washcloth to your face 3 to 4 times a day, or as directed by your caregiver. Use saline nasal sprays to help moisten and clean your sinuses. Take over-the-counter or prescription medicines for pain, discomfort, or fever only as directed by your caregiver. SEEK IMMEDIATE MEDICAL CARE IF: You have increasing pain or severe headaches. You have nausea, vomiting, or drowsiness. You have swelling around your face. You have vision problems. You have a stiff neck. You have difficulty breathing. MAKE SURE YOU:  Understand these instructions. Will watch your condition. Will get help right away if you are not doing well or get worse. Document  Released: 01/02/2005 Document Revised: 03/27/2011 Document Reviewed: 01/17/2011 [facility] Patient Information 2014 [place], [place].    If you have been instructed to have an in-person evaluation today at a local Urgent Care facility, please use the link below. It will take you to a list of all of our available [facility], including address, phone number and hours of operation. Please do not delay care.  [facility]  If you or a family member do not have a primary care provider, use the link below to schedule a visit and establish care. When you choose a [facility] primary care physician or advanced practice provider, you gain a long-term partner in health. Find a Primary Care Provider  Learn more about [facility]'s in-office and virtual care options: [facility] - Get Care Now

## 2020-11-08 ENCOUNTER — Ambulatory Visit
Admission: EM | Admit: 2020-11-08 | Discharge: 2020-11-08 | Disposition: A | Discharge status: Home / Self Care | Payer: Medicaid Other | Attending: Emergency Medicine | Admitting: Emergency Medicine

## 2020-11-08 ENCOUNTER — Other Ambulatory Visit: Payer: Self-pay

## 2020-11-08 DIAGNOSIS — N898 Other specified noninflammatory disorders of vagina: Secondary | ICD-10-CM

## 2020-11-08 DIAGNOSIS — Z113 Encounter for screening for infections with a predominantly sexual mode of transmission: Secondary | ICD-10-CM | POA: Diagnosis not present

## 2020-11-08 LAB — POCT URINALYSIS DIP (MANUAL ENTRY)
Bilirubin, UA: NEGATIVE
Blood, UA: NEGATIVE
Glucose, UA: NEGATIVE mg/dL
Ketones, POC UA: NEGATIVE mg/dL
Leukocytes, UA: NEGATIVE
Nitrite, UA: NEGATIVE
Protein Ur, POC: NEGATIVE mg/dL
Spec Grav, UA: 1.015 (ref 1.010–1.025)
Urobilinogen, UA: 1 E.U./dL
pH, UA: 6 (ref 5.0–8.0)

## 2020-11-08 LAB — POCT URINE PREGNANCY: Preg Test, Ur: NEGATIVE

## 2020-11-08 NOTE — ED Triage Notes (Signed)
Pt requesting to be tested for STDs, she does report having an vaginal odor.

## 2020-11-08 NOTE — ED Provider Notes (Signed)
[facility]    CSN: [id number] Arrival date & time: 11/08/20  1445      History   Chief Complaint Chief Complaint  Patient presents with   SEXUALLY TRANSMITTED DISEASE    HPI Kristen Gomez is a 25 y.o. female.   Pt requesting to be tested for STDs, she does report having an vaginal odor for the past few weeks.  Patient denies burning with urination, pelvic pain pelvic pressure, vaginal discharge, genital lesion, fever, aches, chills, dyspareunia.  The history is provided by the patient.   Past Medical History:  Diagnosis Date   Anxiety    Headache    History of cesarean section, low transverse 12/28/2017   S/P cesarean section 09/30/2014   UTI (urinary tract infection)     Patient Active Problem List   Diagnosis Date Noted   Status post repeat low transverse cesarean section 12/31/2017   History of cesarean section, low transverse 12/28/2017   S/P cesarean section 09/30/2014   Labor and delivery, indication for care 09/29/2014    Past Surgical History:  Procedure Laterality Date   CESAREAN SECTION N/A 09/30/2014   Procedure: CESAREAN SECTION;  Surgeon: Sherian Rein, MD;  Location: [facility];  Service: Obstetrics;  Laterality: N/A;   CESAREAN SECTION N/A 12/31/2017   Procedure: REPEAT CESAREAN SECTION;  Surgeon: Sherian Rein, MD;  Location: [facility];  Service: Obstetrics;  Laterality: N/AHerbert Seta,  RNFA   CESAREAN SECTION N/A 09/24/2019   Procedure: CESAREAN SECTION;  Surgeon: Sherian Rein, MD;  Location: [facility];  Service: Obstetrics;  Laterality: N/A;  Tracey RNFA   TONSILLECTOMY      OB History     Gravida  3   Para  3   Term  3   Preterm      AB      Living  3      SAB      IAB      Ectopic      Multiple  0   Live Births  3            Home Medications    Prior to Admission medications   Medication Sig Start Date End Date Taking? Authorizing Provider  amoxicillin-clavulanate  (AUGMENTIN) 875-125 MG tablet Take 1 tablet by mouth 2 (two) times daily. 11/02/20   Waldon Merl, PA-C  fluticasone (FLONASE) 50 MCG/ACT nasal spray Place 2 sprays into both nostrils daily. 11/02/20   Waldon Merl, PA-C  levonorgestrel-ethinyl estradiol (AVIANE) 0.1-20 MG-MCG tablet Aviane 0.1 mg-20 mcg tablet  TAKE 1 TABLET BY MOUTH ONCE DAILY    [provider]  predniSONE (DELTASONE) 20 MG tablet Take 2 tablets (40 mg total) by mouth daily with breakfast. 11/02/20   Waldon Merl, PA-C  Prenatal Vit-Iron Carbonyl-FA (PNV TABS 29-1) 29-1 MG TABS Take 1 tablet by mouth daily. 06/23/19   [provider]    Family History Family History  Problem Relation Age of Onset   Healthy Mother    Healthy Father     Social History Social History   Tobacco Use   Smoking status: Never   Smokeless tobacco: Never  Vaping Use   Vaping Use: Never used  Substance Use Topics   Alcohol use: No   Drug use: No     Allergies   Patient has no known allergies.   Review of Systems Review of Systems   Physical Exam Triage Vital Signs ED Triage Vitals  Enc Vitals  Group     BP 11/08/20 1541 102/68     Pulse Rate 11/08/20 1541 69     Resp 11/08/20 1541 18     Temp 11/08/20 1541 99 F (37.2 C)     Temp Source 11/08/20 1541 Oral     SpO2 11/08/20 1541 97 %     Weight --      Height --      Head Circumference --      Peak Flow --      Pain Score 11/08/20 1540 0     Pain Loc --      Pain Edu? --      Excl. in GC? --    No data found.  Updated Vital Signs BP 102/68 (BP Location: Right Arm)   Pulse 69   Temp 99 F (37.2 C) (Oral)   Resp 18   LMP 10/22/2020   SpO2 97%   Visual Acuity Right Eye Distance:   Left Eye Distance:   Bilateral Distance:    Right Eye Near:   Left Eye Near:    Bilateral Near:     Physical Exam Vitals and nursing note reviewed.  Constitutional:      Appearance: Normal appearance.  HENT:     Head: Normocephalic and  atraumatic.     Right Ear: Tympanic membrane, ear canal and external ear normal.     Left Ear: Tympanic membrane, ear canal and external ear normal.     Nose: Nose normal.     Mouth/Throat:     Mouth: Mucous membranes are moist.     Pharynx: Oropharynx is clear.  Eyes:     Extraocular Movements: Extraocular movements intact.     Conjunctiva/sclera: Conjunctivae normal.     Pupils: Pupils are equal, round, and reactive to light.  Cardiovascular:     Rate and Rhythm: Normal rate and regular rhythm.     Pulses: Normal pulses.     Heart sounds: Normal heart sounds.  Pulmonary:     Effort: Pulmonary effort is normal.     Breath sounds: Normal breath sounds.  Abdominal:     General: Abdomen is flat. Bowel sounds are normal.     Palpations: Abdomen is soft.  Genitourinary:    Comments: Pt politely declines GU exam, pt did provide a swab for testing.   Musculoskeletal:        General: Normal range of motion.     Cervical back: Normal range of motion and neck supple.  Skin:    General: Skin is warm and dry.  Neurological:     General: No focal deficit present.     Mental Status: She is alert and oriented to person, place, and time. Mental status is at baseline.  Psychiatric:        Mood and Affect: Mood normal.        Behavior: Behavior normal.     UC Treatments / Results  Labs (all labs ordered are listed, but only abnormal results are displayed) Labs Reviewed  HIV ANTIBODY (ROUTINE TESTING W REFLEX)   Narrative:    Performed at:  [address] [facility] [address], [place], [place]  [id number] Lab Director: Jolene Schimke MD, Phone:  [phone]  [facility]   Narrative:    Performed at:  [address] [facility] [address], [place], [place]  [id number] Lab Director: Jolene Schimke MD, Phone:  [phone]  POCT URINALYSIS DIP (MANUAL ENTRY)  POCT URINE PREGNANCY  CERVICOVAGINAL ANCILLARY ONLY  EKG   Radiology No results found.  Procedures Procedures (including  critical care time)  Medications Ordered in UC Medications - No data to display  Initial Impression / Assessment and Plan / UC Course  I have reviewed the triage vital signs and the nursing notes.  Pertinent labs & imaging results that were available during my care of the patient were reviewed by me and considered in my medical decision making (see chart for details).     STD testing was performed as requested.  Patient advised that she will be notified of the results once received, treatment will be provided as needed.  Patient verbalized understanding and agreement of plan as discussed.  All questions were addressed during visit.  Please see discharge instructions below for further details of plan.  Final Clinical Impressions(s) / UC Diagnoses   Final diagnoses:  Vaginal odor  Screening examination for STD (sexually transmitted disease)     Discharge Instructions      The results of your STD screening will be made available to you once they have been received from the lab.  If treatment is necessary, that will be provided for you.     ED Prescriptions   None    PDMP not reviewed this encounter.   Theadora Rama Scales, PA-C 11/09/20 [phone]

## 2020-11-08 NOTE — Discharge Instructions (Addendum)
The results of your STD screening will be made available to you once they have been received from the lab.  If treatment is necessary, that will be provided for you.

## 2020-11-09 LAB — RPR: RPR Ser Ql: NONREACTIVE

## 2020-11-09 LAB — HIV ANTIBODY (ROUTINE TESTING W REFLEX): HIV Screen 4th Generation wRfx: NONREACTIVE

## 2020-11-10 LAB — CERVICOVAGINAL ANCILLARY ONLY
Bacterial Vaginitis (gardnerella): POSITIVE — AB
Candida Glabrata: NEGATIVE
Candida Vaginitis: NEGATIVE
Chlamydia: NEGATIVE
Comment: NEGATIVE
Comment: NEGATIVE
Comment: NEGATIVE
Comment: NEGATIVE
Comment: NEGATIVE
Comment: NORMAL
Neisseria Gonorrhea: NEGATIVE
Trichomonas: NEGATIVE

## 2020-11-12 ENCOUNTER — Telehealth (HOSPITAL_COMMUNITY): Payer: Self-pay | Admitting: Emergency Medicine

## 2020-11-12 MED ORDER — METRONIDAZOLE 500 MG PO TABS: 500.0000 mg | ORAL_TABLET | Freq: Two times a day (BID) | ORAL | 0 refills | Status: DC

## 2021-01-04 ENCOUNTER — Telehealth: Payer: Medicaid Other | Admitting: Family Medicine

## 2021-01-04 DIAGNOSIS — R519 Headache, unspecified: Secondary | ICD-10-CM | POA: Diagnosis not present

## 2021-01-04 MED ORDER — IBUPROFEN 800 MG PO TABS: 800.0000 mg | ORAL_TABLET | Freq: Three times a day (TID) | ORAL | 0 refills | Status: DC | PRN

## 2021-01-04 MED ORDER — RIZATRIPTAN BENZOATE 10 MG PO TABS: 10.0000 mg | ORAL_TABLET | ORAL | 0 refills | Status: DC | PRN

## 2021-01-04 NOTE — Progress Notes (Signed)
Virtual Visit Consent   Kristen Gomez, you are scheduled for a virtual visit with a [facility] provider today.     Just as with appointments in the office, your consent must be obtained to participate.  Your consent will be active for this visit and any virtual visit you may have with one of our providers in the next 365 days.     If you have a MyChart account, a copy of this consent can be sent to you electronically.  All virtual visits are billed to your insurance company just like a traditional visit in the office.    As this is a virtual visit, video technology does not allow for your provider to perform a traditional examination.  This may limit your provider's ability to fully assess your condition.  If your provider identifies any concerns that need to be evaluated in person or the need to arrange testing (such as labs, EKG, etc.), we will make arrangements to do so.     Although advances in technology are sophisticated, we cannot ensure that it will always work on either your end or our end.  If the connection with a video visit is poor, the visit may have to be switched to a telephone visit.  With either a video or telephone visit, we are not always able to ensure that we have a secure connection.     I need to obtain your verbal consent now.   Are you willing to proceed with your visit today?    Kristen Gomez has provided verbal consent on 01/04/2021 for a virtual visit (video or telephone).   Freddy Finner, NP   Date: 01/04/2021 12:16 PM   Virtual Visit via Video Note   I, Freddy Finner, connected with  Kristen Gomez  ([id number], 1995-06-06) on 01/04/21 at 12:15 PM EST by a video-enabled telemedicine application and verified that I am speaking with the correct person using two identifiers.  Location: Patient: Virtual Visit Location Patient: Home Provider: Virtual Visit Location Provider: Home Office   I discussed the limitations of evaluation and  management by telemedicine and the availability of in person appointments. The patient expressed understanding and agreed to proceed.    History of Present Illness: Kristen Gomez is a 25 y.o. who identifies as a female who was assigned female at birth, and is being seen today for headache.  HPI: Headache  This is a new problem. The current episode started in the past 7 days. The problem occurs intermittently. The problem has been waxing and waning. Associated symptoms include blurred vision, nausea, phonophobia, photophobia and rhinorrhea. Pertinent negatives include no sinus pressure, sore throat or vomiting. Nothing aggravates the symptoms. She has tried acetaminophen and NSAIDs for the symptoms. The treatment provided mild relief.    Taking 400 mg at one time every 12 hours. But limited Tylenol.  Problems:  Patient Active Problem List   Diagnosis Date Noted   Status post repeat low transverse cesarean section 12/31/2017   History of cesarean section, low transverse 12/28/2017   S/P cesarean section 09/30/2014   Labor and delivery, indication for care 09/29/2014    Allergies: No Known Allergies Medications:  Current Outpatient Medications:    fluticasone (FLONASE) 50 MCG/ACT nasal spray, Place 2 sprays into both nostrils daily., Disp: 16 g, Rfl: 0   levonorgestrel-ethinyl estradiol (AVIANE) 0.1-20 MG-MCG tablet, Aviane 0.1 mg-20 mcg tablet  TAKE 1 TABLET BY MOUTH ONCE DAILY, Disp: , Rfl:  metroNIDAZOLE (FLAGYL) 500 MG tablet, Take 1 tablet (500 mg total) by mouth 2 (two) times daily., Disp: 14 tablet, Rfl: 0   predniSONE (DELTASONE) 20 MG tablet, Take 2 tablets (40 mg total) by mouth daily with breakfast., Disp: 6 tablet, Rfl: 0   Prenatal Vit-Iron Carbonyl-FA (PNV TABS 29-1) 29-1 MG TABS, Take 1 tablet by mouth daily., Disp: , Rfl:   Observations/Objective: Patient is well-developed, well-nourished in no acute distress.  Resting comfortably at home.  Head is normocephalic,  atraumatic.  No labored breathing.  Speech is clear and coherent with logical content.  Patient is alert and oriented at baseline.    Assessment and Plan:   1. Acute nonintractable headache, unspecified headache type OTC measures discussed and on AVS Maxalt sent for abortative therapy if needed   - rizatriptan (MAXALT) 10 MG tablet; Take 1 tablet (10 mg total) by mouth as needed for migraine. May repeat in 2 hours if needed  Dispense: 10 tablet; Refill: 0   Follow Up Instructions: I discussed the assessment and treatment plan with the patient. The patient was provided an opportunity to ask questions and all were answered. The patient agreed with the plan and demonstrated an understanding of the instructions.  A copy of instructions were sent to the patient via MyChart unless otherwise noted below.    The patient was advised to call back or seek an in-person evaluation if the symptoms worsen or if the condition fails to improve as anticipated.  Time:  I spent 11 minutes with the patient via telehealth technology discussing the above problems/concerns.    Freddy Finner, NP

## 2021-01-04 NOTE — Patient Instructions (Signed)
I appreciate the opportunity to provide you with care for your health and wellness.  Take 1,000 mg of tylenol and 800 mg of ibuprofen with a cup of caffeine and take a nap if able.  If this does not work, please use medication sent in for you.   Follow up with PCP if not improved for BP check  Also get vision checked as soon as you can.   Happy Holidays!  Please continue to practice social distancing to keep you, your family, and our community safe.  If you must go out, please wear a mask and practice good handwashing.  Have a wonderful day. With Gratitude, Tereasa Coop, DNP, AGNP-BC

## 2021-01-12 ENCOUNTER — Ambulatory Visit (HOSPITAL_COMMUNITY)
Admission: EM | Admit: 2021-01-12 | Discharge: 2021-01-12 | Disposition: A | Discharge status: Home / Self Care | Payer: Medicaid Other | Attending: Internal Medicine | Admitting: Internal Medicine

## 2021-01-12 ENCOUNTER — Encounter (HOSPITAL_COMMUNITY): Payer: Self-pay

## 2021-01-12 ENCOUNTER — Other Ambulatory Visit: Payer: Self-pay

## 2021-01-12 DIAGNOSIS — R0789 Other chest pain: Secondary | ICD-10-CM | POA: Diagnosis not present

## 2021-01-12 IMAGING — DX DG KNEE COMPLETE 4+V*R*
5 series · 5 of 5 positions shown · non-contrast
Comparison: None

CLINICAL DATA: Knee pain and swelling, pain with weight-bearing

EXAM:
RIGHT KNEE - COMPLETE 4+ VIEW

[knee ap]
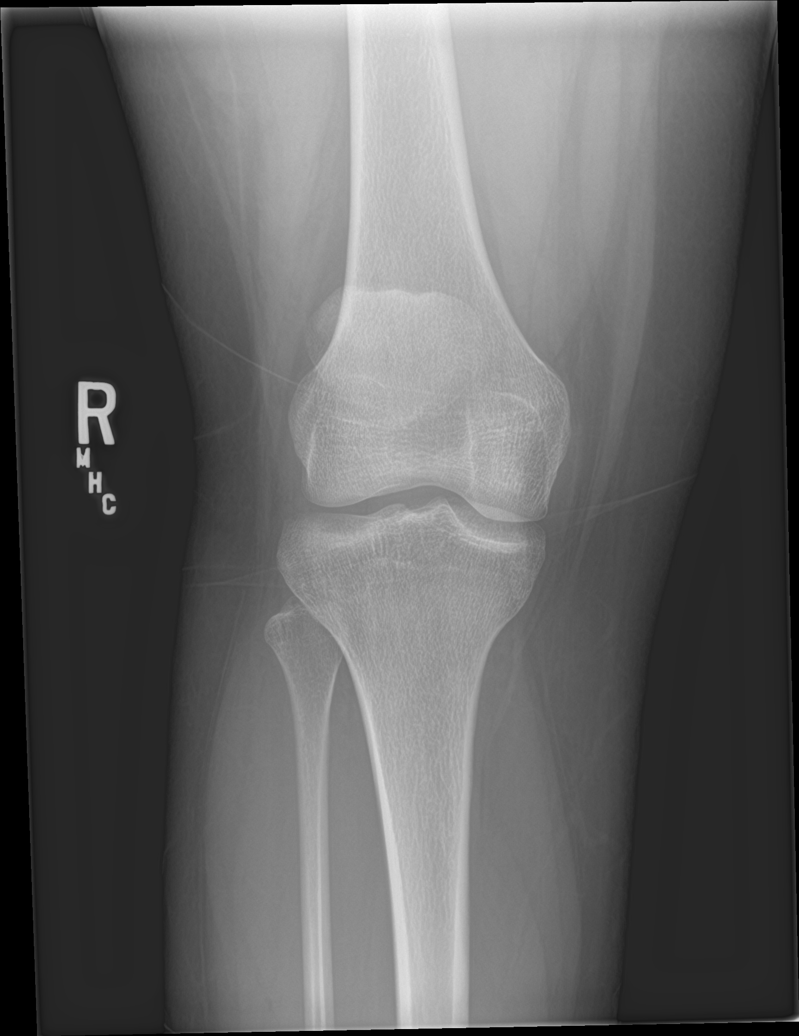

[knee obl (1 of 2)]
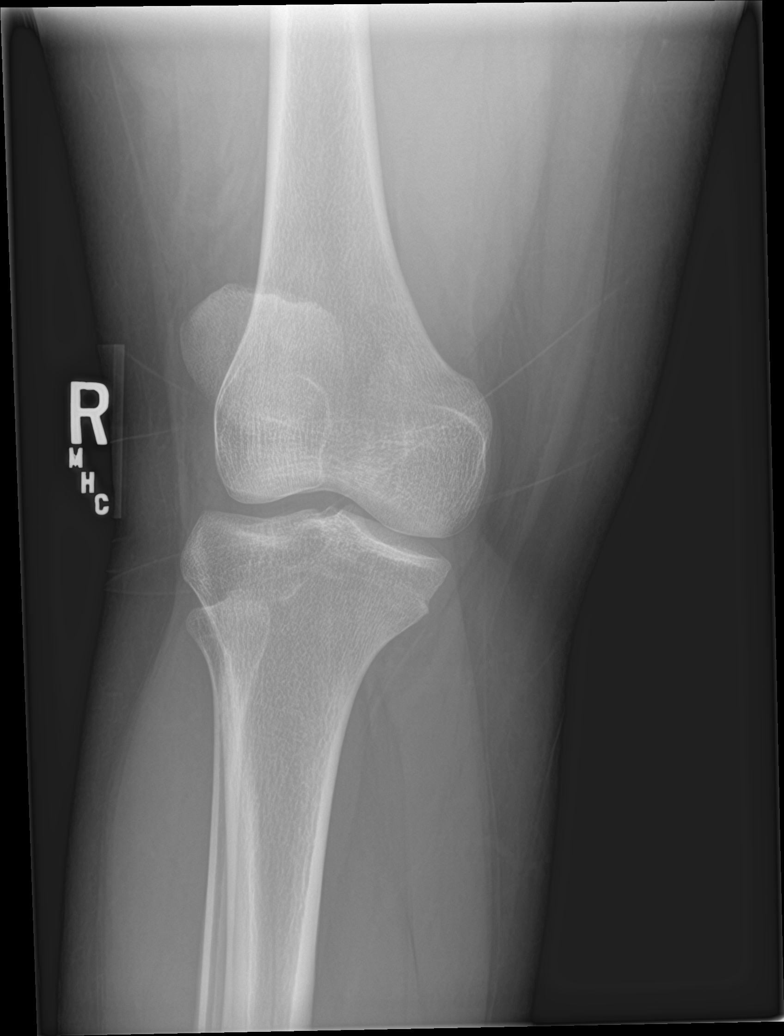

[knee obl (2 of 2)]
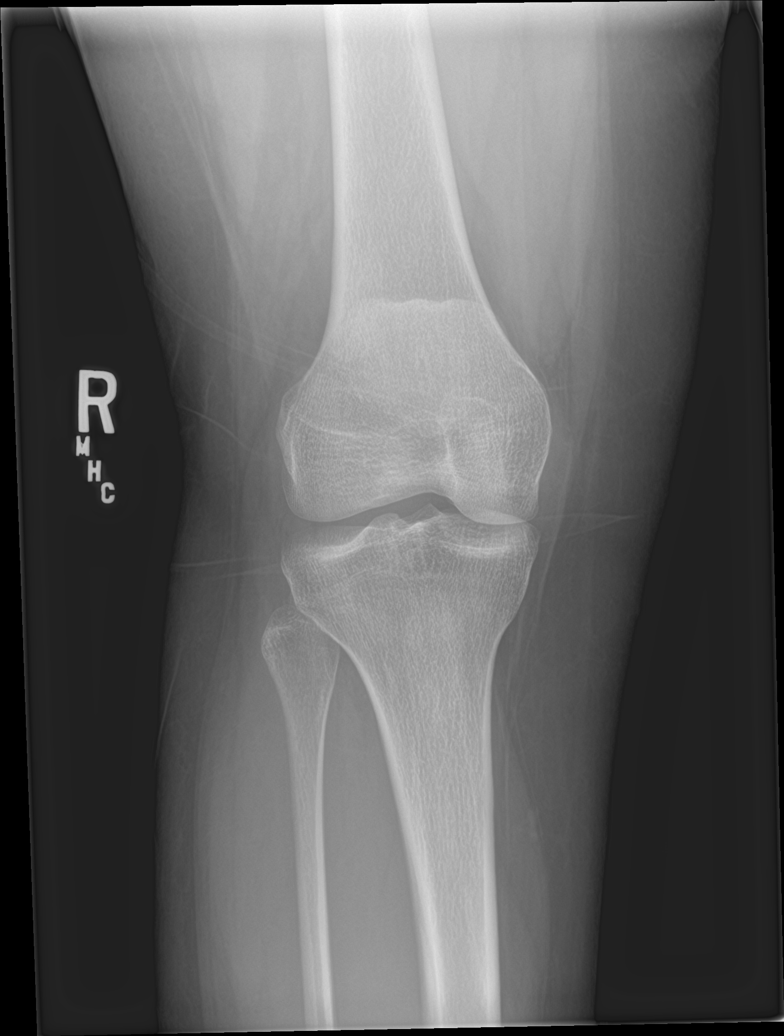

[knee lat]
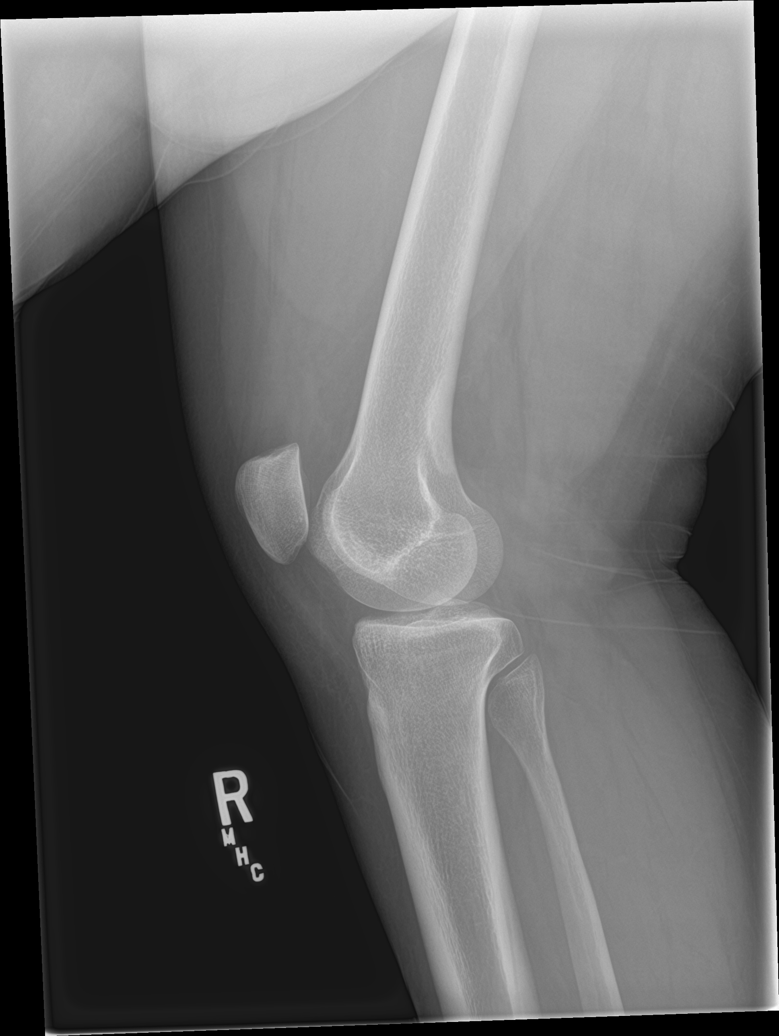

[knee sunrise]
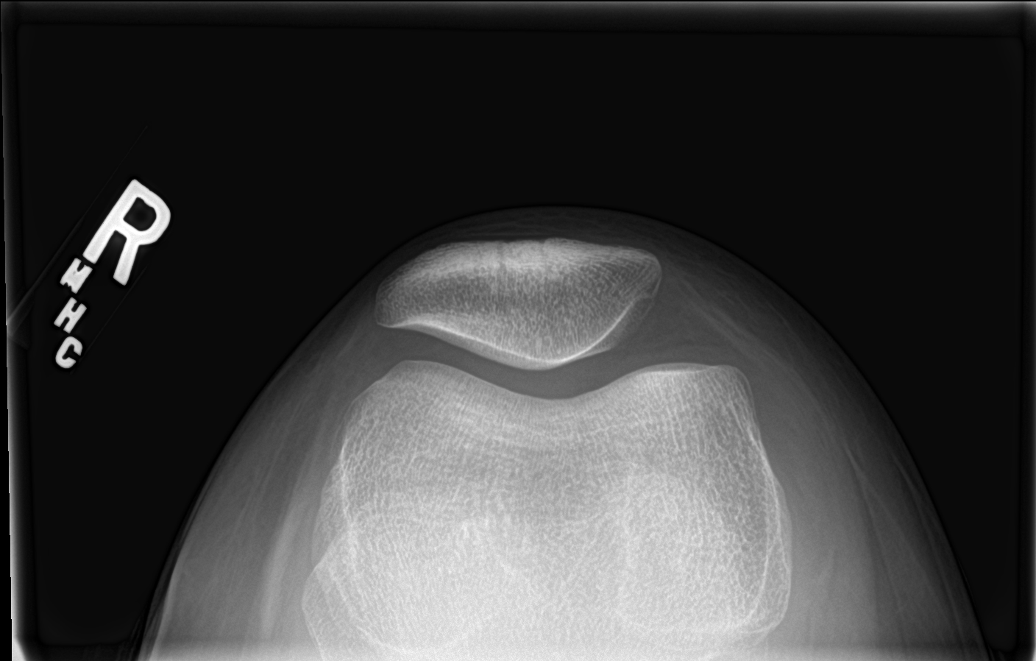

[5 of 5 positions shown; findings below may reference images not displayed]

FINDINGS: No sign of fracture or dislocation. No sign of joint effusion. Soft
tissues are unremarkable.
IMPRESSION: Negative evaluation of the RIGHT knee.

## 2021-01-12 MED ORDER — IBUPROFEN 600 MG PO TABS: 600.0000 mg | ORAL_TABLET | Freq: Four times a day (QID) | ORAL | 0 refills | Status: AC | PRN

## 2021-01-12 NOTE — ED Provider Notes (Signed)
[facility]    CSN: [id number] Arrival date & time: 01/12/21  1045      History   Chief Complaint Chief Complaint  Patient presents with   Chest Pain    HPI Kristen Gomez is a 25 y.o. female comes to the urgent care with left-sided chest pain which started a few days ago.  Patient describes the pain as 8 out of 10 in severity, sharp, aggravated by movement of the shoulder and relieved by taking analgesics.  Patient denies any trauma to the chest.  No heavy lifting.  Pain is on the left side of the chest and can be reproduced when she pushes over the pectoralis muscles.  No fever or chills.  No cough or sputum production. HPI  Past Medical History:  Diagnosis Date   Anxiety    Headache    History of cesarean section, low transverse 12/28/2017   S/P cesarean section 09/30/2014   UTI (urinary tract infection)     Patient Active Problem List   Diagnosis Date Noted   Status post repeat low transverse cesarean section 12/31/2017   History of cesarean section, low transverse 12/28/2017   S/P cesarean section 09/30/2014   Labor and delivery, indication for care 09/29/2014    Past Surgical History:  Procedure Laterality Date   CESAREAN SECTION N/A 09/30/2014   Procedure: CESAREAN SECTION;  Surgeon: Janyth Contes, MD;  Location: [facility];  Service: Obstetrics;  Laterality: N/A;   CESAREAN SECTION N/A 12/31/2017   Procedure: REPEAT CESAREAN SECTION;  Surgeon: Janyth Contes, MD;  Location: [facility];  Service: Obstetrics;  Laterality: N/ANira Conn,  RNFA   CESAREAN SECTION N/A 09/24/2019   Procedure: CESAREAN SECTION;  Surgeon: Janyth Contes, MD;  Location: [facility];  Service: Obstetrics;  Laterality: N/A;  Tracey RNFA   TONSILLECTOMY      OB History     Gravida  3   Para  3   Term  3   Preterm      AB      Living  3      SAB      IAB      Ectopic      Multiple  0   Live Births  3            Home  Medications    Prior to Admission medications   Medication Sig Start Date End Date Taking? Authorizing Provider  ibuprofen (ADVIL) 600 MG tablet Take 1 tablet (600 mg total) by mouth every 6 (six) hours as needed. 01/12/21  Yes Tremon Sainvil, Myrene Galas, MD  fluticasone (FLONASE) 50 MCG/ACT nasal spray Place 2 sprays into both nostrils daily. 11/02/20   Brunetta Jeans, PA-C  levonorgestrel-ethinyl estradiol (AVIANE) 0.1-20 MG-MCG tablet Aviane 0.1 mg-20 mcg tablet  TAKE 1 TABLET BY MOUTH ONCE DAILY    [provider]  Prenatal Vit-Iron Carbonyl-FA (PNV TABS 29-1) 29-1 MG TABS Take 1 tablet by mouth daily. 06/23/19   [provider]  rizatriptan (MAXALT) 10 MG tablet Take 1 tablet (10 mg total) by mouth as needed for migraine. May repeat in 2 hours if needed 01/04/21   Perlie Mayo, NP    Family History Family History  Problem Relation Age of Onset   Healthy Mother    Healthy Father     Social History Social History   Tobacco Use   Smoking status: Never   Smokeless tobacco: Never  Vaping Use   Vaping Use: Never  used  Substance Use Topics   Alcohol use: No   Drug use: No     Allergies   Patient has no known allergies.   Review of Systems Review of Systems  HENT: Negative.    Respiratory:  Negative for cough, chest tightness and shortness of breath.   Cardiovascular:  Positive for chest pain.    Physical Exam Triage Vital Signs ED Triage Vitals  Enc Vitals Group     BP 01/12/21 1326 114/76     Pulse Rate 01/12/21 1326 69     Resp 01/12/21 1326 17     Temp 01/12/21 1326 98.6 F (37 C)     Temp Source 01/12/21 1326 Oral     SpO2 01/12/21 1326 99 %     Weight --      Height --      Head Circumference --      Peak Flow --      Pain Score 01/12/21 1324 6     Pain Loc --      Pain Edu? --      Excl. in GC? --    No data found.  Updated Vital Signs BP 114/76 (BP Location: Left Arm)    Pulse 69    Temp 98.6 F (37 C) (Oral)    Resp 17    SpO2 99%    Visual Acuity Right Eye Distance:   Left Eye Distance:   Bilateral Distance:    Right Eye Near:   Left Eye Near:    Bilateral Near:     Physical Exam Vitals and nursing note reviewed.  Constitutional:      General: She is not in acute distress.    Appearance: She is not ill-appearing or diaphoretic.  Cardiovascular:     Rate and Rhythm: Normal rate and regular rhythm.     Heart sounds: Normal heart sounds.  Pulmonary:     Breath sounds: No decreased breath sounds, wheezing or rhonchi.  Chest:     Comments: Tenderness over the pectoralis muscles on the left side. Abdominal:     General: Bowel sounds are normal.     Palpations: Abdomen is soft.  Musculoskeletal:     Cervical back: Normal range of motion and neck supple.     UC Treatments / Results  Labs (all labs ordered are listed, but only abnormal results are displayed) Labs Reviewed - No data to display  EKG   Radiology No results found.  Procedures Procedures (including critical care time)  Medications Ordered in UC Medications - No data to display  Initial Impression / Assessment and Plan / UC Course  I have reviewed the triage vital signs and the nursing notes.  Pertinent labs & imaging results that were available during my care of the patient were reviewed by me and considered in my medical decision making (see chart for details).     1.  Left-sided chest wall pain: Gentle range of motion exercises Heating pad use Ibuprofen as needed for pain Lung exam sounds clear Return precautions given Final Clinical Impressions(s) / UC Diagnoses   Final diagnoses:  Left-sided chest wall pain     Discharge Instructions      Gentle range of motion exercises Heating pad use only 20 minutes on-20 minutes off cycle will help with the pain Take medications as prescribed If symptoms worsen please return to urgent care to be reevaluated No indication for x-rays at this time.   ED Prescriptions  Medication Sig Dispense Auth. Provider   ibuprofen (ADVIL) 600 MG tablet Take 1 tablet (600 mg total) by mouth every 6 (six) hours as needed. 30 tablet Zitlaly Malson, Myrene Galas, MD      PDMP not reviewed this encounter.   Chase Picket, MD 01/12/21 1420

## 2021-01-12 NOTE — ED Triage Notes (Signed)
Pt presents with constant central chest pain since yesterday; pt states it hurts more when she breathes in and out.

## 2021-01-12 NOTE — Discharge Instructions (Addendum)
Gentle range of motion exercises Heating pad use only 20 minutes on-20 minutes off cycle will help with the pain Take medications as prescribed If symptoms worsen please return to urgent care to be reevaluated No indication for x-rays at this time.

## 2021-03-10 ENCOUNTER — Telehealth: Payer: Medicaid Other | Admitting: Nurse Practitioner

## 2021-03-10 DIAGNOSIS — G43009 Migraine without aura, not intractable, without status migrainosus: Secondary | ICD-10-CM

## 2021-03-10 MED ORDER — NAPROXEN 500 MG PO TABS: 500.0000 mg | ORAL_TABLET | Freq: Two times a day (BID) | ORAL | 1 refills | Status: DC

## 2021-03-10 NOTE — Progress Notes (Signed)
Virtual Visit Consent   Kristen Gomez, you are scheduled for a virtual visit with Mary-Margaret Hassell Done, [place], a [facility] provider, today.     Just as with appointments in the office, your consent must be obtained to participate.  Your consent will be active for this visit and any virtual visit you may have with one of our providers in the next 365 days.     If you have a MyChart account, a copy of this consent can be sent to you electronically.  All virtual visits are billed to your insurance company just like a traditional visit in the office.    As this is a virtual visit, video technology does not allow for your provider to perform a traditional examination.  This may limit your provider's ability to fully assess your condition.  If your provider identifies any concerns that need to be evaluated in person or the need to arrange testing (such as labs, EKG, etc.), we will make arrangements to do so.     Although advances in technology are sophisticated, we cannot ensure that it will always work on either your end or our end.  If the connection with a video visit is poor, the visit may have to be switched to a telephone visit.  With either a video or telephone visit, we are not always able to ensure that we have a secure connection.     I need to obtain your verbal consent now.   Are you willing to proceed with your visit today? YES   Kendyll Lyrae Mesnard has provided verbal consent on 03/10/2021 for a virtual visit (video or telephone).   Mary-Margaret Hassell Done, FNP   Date: 03/10/2021 9:41 AM   Virtual Visit via Video Note   I, Mary-Margaret Hassell Done, connected with Cachet Pilson ([id number], 17-Jul-1995) on 03/10/21 at 10:15 AM EST by a video-enabled telemedicine application and verified that I am speaking with the correct person using two identifiers.  Location: Patient: Virtual Visit Location Patient: Home Provider: Virtual Visit Location Provider: Mobile   I  discussed the limitations of evaluation and management by telemedicine and the availability of in person appointments. The patient expressed understanding and agreed to proceed.    History of Present Illness: Kristen Gomez is a 26 y.o. who identifies as a female who was assigned female at birth, and is being seen today for headache .  HPI: Patient does video stating she has been having headaches. Last night she developed a headache on right sided. Rted 10/10. Had  to go  to bed to relieve headache. Has slight headache this morning, rates pain 5/10 currently. Took aleve last night and at 3AM.    Review of Systems  Constitutional:  Negative for chills, fever and malaise/fatigue.  HENT:  Positive for congestion (slight).   Respiratory: Negative.    Cardiovascular: Negative.   Gastrointestinal:  Negative for nausea and vomiting.  Neurological:  Positive for headaches. Negative for dizziness.   Problems:  Patient Active Problem List   Diagnosis Date Noted   Status post repeat low transverse cesarean section 12/31/2017   History of cesarean section, low transverse 12/28/2017   S/P cesarean section 09/30/2014   Labor and delivery, indication for care 09/29/2014    Allergies: No Known Allergies Medications:  Current Outpatient Medications:    fluticasone (FLONASE) 50 MCG/ACT nasal spray, Place 2 sprays into both nostrils daily., Disp: 16 g, Rfl: 0   ibuprofen (ADVIL) 600 MG tablet, Take 1 tablet (600  mg total) by mouth every 6 (six) hours as needed., Disp: 30 tablet, Rfl: 0   levonorgestrel-ethinyl estradiol (AVIANE) 0.1-20 MG-MCG tablet, Aviane 0.1 mg-20 mcg tablet  TAKE 1 TABLET BY MOUTH ONCE DAILY, Disp: , Rfl:    Prenatal Vit-Iron Carbonyl-FA (PNV TABS 29-1) 29-1 MG TABS, Take 1 tablet by mouth daily., Disp: , Rfl:    rizatriptan (MAXALT) 10 MG tablet, Take 1 tablet (10 mg total) by mouth as needed for migraine. May repeat in 2 hours if needed, Disp: 10 tablet, Rfl:  0  Observations/Objective: Patient is well-developed, well-nourished in no acute distress.  Resting comfortably  at home.  Head is normocephalic, atraumatic.  No labored breathing.  Speech is clear and coherent with logical content.  Patient is alert and oriented at baseline.    Assessment and Plan:  Hilary Hertz in today with chief complaint of Headache   1. Migraine without aura and without status migrainosus, not intractable Avoid caffeine Keep diary of headaches Rest in dark room when occur. Needs to get a PCP for preventative treatment Meds ordered this encounter  Medications   naproxen (NAPROSYN) 500 MG tablet    Sig: Take 1 tablet (500 mg total) by mouth 2 (two) times daily with a meal.    Dispense:  60 tablet    Refill:  1    Order Specific Question:   Supervising Provider    Answer:   Noemi Chapel [[id number]]       Follow Up Instructions: I discussed the assessment and treatment plan with the patient. The patient was provided an opportunity to ask questions and all were answered. The patient agreed with the plan and demonstrated an understanding of the instructions.  A copy of instructions were sent to the patient via MyChart.  The patient was advised to call back or seek an in-person evaluation if the symptoms worsen or if the condition fails to improve as anticipated.  Time:  I spent 7 minutes with the patient via telehealth technology discussing the above problems/concerns.    Mary-Margaret Hassell Done, FNP

## 2021-03-10 NOTE — Patient Instructions (Signed)
Migraine Headache A migraine headache is an intense, throbbing pain on one side or both sides of the head. Migraine headaches may also cause other symptoms, such as nausea, vomiting, and sensitivity to light and noise. A migraine headache can last from 4 hours to 3 days. Talk with your doctor about what things may bring on (trigger) your migraine headaches. What are the causes? The exact cause of this condition is not known. However, a migraine may be caused when nerves in the brain become irritated and release chemicals that cause inflammation of blood vessels. This inflammation causes pain. This condition may be triggered or caused by: Drinking alcohol. Smoking. Taking medicines, such as: Medicine used to treat chest pain (nitroglycerin). Birth control pills. Estrogen. Certain blood pressure medicines. Eating or drinking products that contain nitrates, glutamate, aspartame, or tyramine. Aged cheeses, chocolate, or caffeine may also be triggers. Doing physical activity. Other things that may trigger a migraine headache include: Menstruation. Pregnancy. Hunger. Stress. Lack of sleep or too much sleep. Weather changes. Fatigue. What increases the risk? The following factors may make you more likely to experience migraine headaches: Being a certain age. This condition is more common in people who are 25-55 years old. Being female. Having a family history of migraine headaches. Being Caucasian. Having a mental health condition, such as depression or anxiety. Being obese. What are the signs or symptoms? The main symptom of this condition is pulsating or throbbing pain. This pain may: Happen in any area of the head, such as on one side or both sides. Interfere with daily activities. Get worse with physical activity. Get worse with exposure to bright lights or loud noises. Other symptoms may include: Nausea. Vomiting. Dizziness. General sensitivity to bright lights, loud noises, or  smells. Before you get a migraine headache, you may get warning signs (an aura). An aura may include: Seeing flashing lights or having blind spots. Seeing bright spots, halos, or zigzag lines. Having tunnel vision or blurred vision. Having numbness or a tingling feeling. Having trouble talking. Having muscle weakness. Some people have symptoms after a migraine headache (postdromal phase), such as: Feeling tired. Difficulty concentrating. How is this diagnosed? A migraine headache can be diagnosed based on: Your symptoms. A physical exam. Tests, such as: CT scan or an MRI of the head. These imaging tests can help rule out other causes of headaches. Taking fluid from the spine (lumbar puncture) and analyzing it (cerebrospinal fluid analysis, or CSF analysis). How is this treated? This condition may be treated with medicines that: Relieve pain. Relieve nausea. Prevent migraine headaches. Treatment for this condition may also include: Acupuncture. Lifestyle changes like avoiding foods that trigger migraine headaches. Biofeedback. Cognitive behavioral therapy. Follow these instructions at home: Medicines Take over-the-counter and prescription medicines only as told by your health care provider. Ask your health care provider if the medicine prescribed to you: Requires you to avoid driving or using heavy machinery. Can cause constipation. You may need to take these actions to prevent or treat constipation: Drink enough fluid to keep your urine pale yellow. Take over-the-counter or prescription medicines. Eat foods that are high in fiber, such as beans, whole grains, and fresh fruits and vegetables. Limit foods that are high in fat and processed sugars, such as fried or sweet foods. Lifestyle Do not drink alcohol. Do not use any products that contain nicotine or tobacco, such as cigarettes, e-cigarettes, and chewing tobacco. If you need help quitting, ask your health care  provider. Get at least 8   hours of sleep every night. Find ways to manage stress, such as meditation, deep breathing, or yoga. General instructions   Keep a journal to find out what may trigger your migraine headaches. For example, write down: What you eat and drink. How much sleep you get. Any change to your diet or medicines. If you have a migraine headache: Avoid things that make your symptoms worse, such as bright lights. It may help to lie down in a dark, quiet room. Do not drive or use heavy machinery. Ask your health care provider what activities are safe for you while you are experiencing symptoms. Keep all follow-up visits as told by your health care provider. This is important. Contact a health care provider if: You develop symptoms that are different or more severe than your usual migraine headache symptoms. You have more than 15 headache days in one month. Get help right away if: Your migraine headache becomes severe. Your migraine headache lasts longer than 72 hours. You have a fever. You have a stiff neck. You have vision loss. Your muscles feel weak or like you cannot control them. You start to lose your balance often. You have trouble walking. You faint. You have a seizure. Summary A migraine headache is an intense, throbbing pain on one side or both sides of the head. Migraines may also cause other symptoms, such as nausea, vomiting, and sensitivity to light and noise. This condition may be treated with medicines and lifestyle changes. You may also need to avoid certain things that trigger a migraine headache. Keep a journal to find out what may trigger your migraine headaches. Contact your health care provider if you have more than 15 headache days in a month or you develop symptoms that are different or more severe than your usual migraine headache symptoms. This information is not intended to replace advice given to you by your health care provider. Make sure you  discuss any questions you have with your health care provider. Document Revised: 04/26/2018 Document Reviewed: 02/14/2018 Elsevier Patient Education  2022 Elsevier Inc.  

## 2021-05-06 ENCOUNTER — Telehealth: Payer: Medicaid Other | Admitting: Physician Assistant

## 2021-05-06 DIAGNOSIS — R3989 Other symptoms and signs involving the genitourinary system: Secondary | ICD-10-CM | POA: Diagnosis not present

## 2021-05-06 MED ORDER — CEPHALEXIN 500 MG PO CAPS: 500.0000 mg | ORAL_CAPSULE | Freq: Two times a day (BID) | ORAL | 0 refills | Status: AC

## 2021-05-06 NOTE — Patient Instructions (Signed)
?Kristen Gomez, thank you for joining [name], PA-C for today's virtual visit.  While this provider is not your primary care provider (PCP), if your PCP is located in our provider database this encounter information will be shared with them immediately following your visit. ? ?Consent: ?(Patient) Kristen Gomez provided verbal consent for this virtual visit at the beginning of the encounter. ? ?Current Medications: ? ?Current Outpatient Medications:  ?  cephALEXin (KEFLEX) 500 MG capsule, Take 1 capsule (500 mg total) by mouth 2 (two) times daily for 7 days., Disp: 14 capsule, Rfl: 0 ?  fluticasone (FLONASE) 50 MCG/ACT nasal spray, Place 2 sprays into both nostrils daily., Disp: 16 g, Rfl: 0 ?  ibuprofen (ADVIL) 600 MG tablet, Take 1 tablet (600 mg total) by mouth every 6 (six) hours as needed., Disp: 30 tablet, Rfl: 0 ?  levonorgestrel-ethinyl estradiol (AVIANE) 0.1-20 MG-MCG tablet, Aviane 0.1 mg-20 mcg tablet  TAKE 1 TABLET BY MOUTH ONCE DAILY, Disp: , Rfl:  ?  naproxen (NAPROSYN) 500 MG tablet, Take 1 tablet (500 mg total) by mouth 2 (two) times daily with a meal., Disp: 60 tablet, Rfl: 1 ?  Prenatal Vit-Iron Carbonyl-FA (PNV TABS 29-1) 29-1 MG TABS, Take 1 tablet by mouth daily., Disp: , Rfl:  ?  rizatriptan (MAXALT) 10 MG tablet, Take 1 tablet (10 mg total) by mouth as needed for migraine. May repeat in 2 hours if needed, Disp: 10 tablet, Rfl: 0  ? ?Medications ordered in this encounter:  ?Meds ordered this encounter  ?Medications  ? cephALEXin (KEFLEX) 500 MG capsule  ?  Sig: Take 1 capsule (500 mg total) by mouth 2 (two) times daily for 7 days.  ?  Dispense:  14 capsule  ?  Refill:  0  ?  Order Specific Question:   Supervising Provider  ?  Answer:   Eber Hong [[id number]]  ?  ? ?*If you need refills on other medications prior to your next appointment, please contact your pharmacy* ? ?Follow-Up: ?Call back or seek an in-person evaluation if the symptoms worsen or if the condition  fails to improve as anticipated. ? ?Other Instructions ?Your symptoms are consistent with a bladder infection, also called acute cystitis. Please take your antibiotic (Keflex) as directed until all pills are gone.  Stay very well hydrated.  Consider a daily probiotic (Align, Culturelle, or Activia) to help prevent stomach upset caused by the antibiotic.  Taking a probiotic daily may also help prevent recurrent UTIs.  Also consider taking AZO (Phenazopyridine) tablets to help decrease pain with urination.  ? ?Urinary Tract Infection ?A urinary tract infection (UTI) can occur any place along the urinary tract. The tract includes the kidneys, ureters, bladder, and urethra. A type of germ called bacteria often causes a UTI. UTIs are often helped with antibiotic medicine.  ?HOME CARE  ?If given, take antibiotics as told by your doctor. Finish them even if you start to feel better. ?Drink enough fluids to keep your pee (urine) clear or pale yellow. ?Avoid tea, drinks with caffeine, and bubbly (carbonated) drinks. ?Pee often. Avoid holding your pee in for a long time. ?Pee before and after having sex (intercourse). ?Wipe from front to back after you poop (bowel movement) if you are a woman. Use each tissue only once. ?GET HELP RIGHT AWAY IF:  ?You have back pain. ?You have lower belly (abdominal) pain. ?You have chills. ?You feel sick to your stomach (nauseous). ?You throw up (vomit). ?Your burning or discomfort with  peeing does not go away. ?You have a fever. ?Your symptoms are not better in 3 days. ?MAKE SURE YOU:  ?Understand these instructions. ?Will watch your condition. ?Will get help right away if you are not doing well or get worse. ?Document Released: 06/21/2007 Document Revised: 09/27/2011 Document Reviewed: 08/03/2011 ?ExitCare? Patient Information ?2015 [employer]. This information is not intended to replace advice given to you by your health care provider. Make sure you discuss any questions you have with  your health care provider. ? ? ? ?If you have been instructed to have an in-person evaluation today at a local Urgent Care facility, please use the link below. It will take you to a list of all of our available [facility], including address, phone number and hours of operation. Please do not delay care.  ?[facility] ? ?If you or a family member do not have a primary care provider, use the link below to schedule a visit and establish care. When you choose a [facility] primary care physician or advanced practice provider, you gain a long-term partner in health. ?Find a Primary Care Provider ? ?Learn more about [facility]'s in-office and virtual care options: ?[facility] - Get Care Now  ?

## 2021-05-06 NOTE — Progress Notes (Signed)
?Virtual Visit Consent  ? ?Kristen Gomez, you are scheduled for a virtual visit with a [facility] provider today.   ?  ?Just as with appointments in the office, your consent must be obtained to participate.  Your consent will be active for this visit and any virtual visit you may have with one of our providers in the next 365 days.   ?  ?If you have a MyChart account, a copy of this consent can be sent to you electronically.  All virtual visits are billed to your insurance company just like a traditional visit in the office.   ? ?As this is a virtual visit, video technology does not allow for your provider to perform a traditional examination.  This may limit your provider's ability to fully assess your condition.  If your provider identifies any concerns that need to be evaluated in person or the need to arrange testing (such as labs, EKG, etc.), we will make arrangements to do so.   ?  ?Although advances in technology are sophisticated, we cannot ensure that it will always work on either your end or our end.  If the connection with a video visit is poor, the visit may have to be switched to a telephone visit.  With either a video or telephone visit, we are not always able to ensure that we have a secure connection.    ? ?Also, by engaging in this virtual visit, you consent to the provision of healthcare. Additionally, you authorize for your insurance to be billed (if applicable) for the services provided during this visit.  ? ?I need to obtain your verbal consent now.   Are you willing to proceed with your visit today?  ?  ?Kristen Gomez has provided verbal consent on 05/06/2021 for a virtual visit (video or telephone). ?  ?Kristen Climes, PA-C  ? ?Date: 05/06/2021 8:07 AM ? ? ?Virtual Visit via Video Note  ? ?I, Kristen Gomez, connected with  Kristen Gomez  ([id number], January 26, 1995) on 05/06/21 at  8:00 AM EDT by a video-enabled telemedicine application and verified that I am  speaking with the correct person using two identifiers. ? ?Location: ?Patient: Virtual Visit Location Patient: Home ?Provider: Virtual Visit Location Provider: Home Office ?  ?I discussed the limitations of evaluation and management by telemedicine and the availability of in person appointments. The patient expressed understanding and agreed to proceed.   ? ?History of Present Illness: ?Kristen Gomez is a 26 y.o. who identifies as a female who was assigned female at birth, and is being seen today for possible UTI. Notes symptoms starting yesterday with urinary urgency, frequency, hesitancy and dysuria. Denies fever, chills, abdominal pain, flank pain or vomiting. Denies vaginal discharge. LMP 3 weeks ago. Denies concerns for pregnancy.  ? ?HPI: HPI  ?Problems:  ?Patient Active Problem List  ? Diagnosis Date Noted  ? Status post repeat low transverse cesarean section 12/31/2017  ? History of cesarean section, low transverse 12/28/2017  ? S/P cesarean section 09/30/2014  ? Labor and delivery, indication for care 09/29/2014  ?  ?Allergies: No Known Allergies ?Medications:  ?Current Outpatient Medications:  ?  cephALEXin (KEFLEX) 500 MG capsule, Take 1 capsule (500 mg total) by mouth 2 (two) times daily for 7 days., Disp: 14 capsule, Rfl: 0 ?  fluticasone (FLONASE) 50 MCG/ACT nasal spray, Place 2 sprays into both nostrils daily., Disp: 16 g, Rfl: 0 ?  ibuprofen (ADVIL) 600 MG tablet, Take 1 tablet (600  mg total) by mouth every 6 (six) hours as needed., Disp: 30 tablet, Rfl: 0 ?  levonorgestrel-ethinyl estradiol (AVIANE) 0.1-20 MG-MCG tablet, Aviane 0.1 mg-20 mcg tablet  TAKE 1 TABLET BY MOUTH ONCE DAILY, Disp: , Rfl:  ?  naproxen (NAPROSYN) 500 MG tablet, Take 1 tablet (500 mg total) by mouth 2 (two) times daily with a meal., Disp: 60 tablet, Rfl: 1 ?  Prenatal Vit-Iron Carbonyl-FA (PNV TABS 29-1) 29-1 MG TABS, Take 1 tablet by mouth daily., Disp: , Rfl:  ?  rizatriptan (MAXALT) 10 MG tablet, Take 1 tablet (10  mg total) by mouth as needed for migraine. May repeat in 2 hours if needed, Disp: 10 tablet, Rfl: 0 ? ?Observations/Objective: ?Patient is well-developed, well-nourished in no acute distress.  ?Resting comfortably at home.  ?Head is normocephalic, atraumatic.  ?No labored breathing. ?Speech is clear and coherent with logical content.  ?Patient is alert and oriented at baseline.  ? ?Assessment and Plan: ?1. Suspected UTI ?- cephALEXin (KEFLEX) 500 MG capsule; Take 1 capsule (500 mg total) by mouth 2 (two) times daily for 7 days.  Dispense: 14 capsule; Refill: 0 ? ?Mild symptoms. No alarm signs/symptoms present. Will start Keflex 500 mg BID x 7 days empirically for cystitis. Supportive measures and OTC medications reviewed. Strict precautions for an in-person evaluation discussed with patient who voiced understanding.  ? ?Follow Up Instructions: ?I discussed the assessment and treatment plan with the patient. The patient was provided an opportunity to ask questions and all were answered. The patient agreed with the plan and demonstrated an understanding of the instructions.  A copy of instructions were sent to the patient via MyChart unless otherwise noted below.  ? ?The patient was advised to call back or seek an in-person evaluation if the symptoms worsen or if the condition fails to improve as anticipated. ? ?Time:  ?I spent 10 minutes with the patient via telehealth technology discussing the above problems/concerns.   ? ?Kristen Climes, PA-C ?

## 2021-12-23 ENCOUNTER — Ambulatory Visit
Admission: EM | Admit: 2021-12-23 | Discharge: 2021-12-23 | Disposition: A | Discharge status: Home / Self Care | Payer: Medicaid Other | Attending: Emergency Medicine | Admitting: Emergency Medicine

## 2021-12-23 DIAGNOSIS — Z113 Encounter for screening for infections with a predominantly sexual mode of transmission: Secondary | ICD-10-CM

## 2021-12-23 NOTE — ED Triage Notes (Signed)
Pt states she is here for STI testing. Denies sx or exposure.

## 2021-12-23 NOTE — Discharge Instructions (Signed)
Give [place] a working phone number so that we can contact you if needed. Refrain from sexual contact until all of your labs have come back, and your partner(s) are treated if necessary.    Below is a list of primary care practices who are taking new patients for you to follow-up with.  [facility] -multiple locations.  See website at [email or web address]  [facility] internal medicine clinic Ground Floor - [facility], [address] [place], [place], [place] [address] [phone]  [facility] at [facility] [address] [place], [place] [address] [phone]  [facility]- will see patients with no insurance [address] [place], [place] [address] [place], [place] [address] [phone]  [facility] [phone] [address] [place] [address]  [facility]: [address] [place] [address]  [phone]  [facility]: [address] [place] [address]  [phone]  [facility] : [address] [place] [address] [phone]  [facility]: [address] [place] [address] [phone]  [facility]: [address] [place] [address] [phone]  Dr. Oneal Grout [address] [place] [facility]  [facility]- will see patients with no insurance. [address], [place], [place] [address] [phone]  Go to www.goodrx.com  or www.costplusdrugs.com to look up your medications. This will give you a list of where you can find your prescriptions at the most affordable prices. Or ask the pharmacist what the cash price is, or if they have any other discount programs available to help make your medication more affordable. This can be less expensive than what you would pay with  insurance.

## 2021-12-23 NOTE — ED Provider Notes (Signed)
HPI  SUBJECTIVE:  Kristen Gomez is a 26 y.o. female who presents with ***    Past Medical History:  Diagnosis Date   Anxiety    Headache    History of cesarean section, low transverse 12/28/2017   S/P cesarean section 09/30/2014   UTI (urinary tract infection)     Past Surgical History:  Procedure Laterality Date   CESAREAN SECTION N/A 09/30/2014   Procedure: CESAREAN SECTION;  Surgeon: Sherian Rein, MD;  Location: [facility];  Service: Obstetrics;  Laterality: N/A;   CESAREAN SECTION N/A 12/31/2017   Procedure: REPEAT CESAREAN SECTION;  Surgeon: Sherian Rein, MD;  Location: [facility];  Service: Obstetrics;  Laterality: N/AHerbert Seta,  RNFA   CESAREAN SECTION N/A 09/24/2019   Procedure: CESAREAN SECTION;  Surgeon: Sherian Rein, MD;  Location: [facility];  Service: Obstetrics;  Laterality: N/A;  Tracey RNFA   TONSILLECTOMY      Family History  Problem Relation Age of Onset   Healthy Mother    Healthy Father     Social History   Tobacco Use   Smoking status: Never   Smokeless tobacco: Never  Vaping Use   Vaping Use: Never used  Substance Use Topics   Alcohol use: Yes    Comment: occasional   Drug use: Not Currently    Types: Marijuana    No current facility-administered medications for this encounter.  Current Outpatient Medications:    levonorgestrel-ethinyl estradiol (AVIANE) 0.1-20 MG-MCG tablet, Aviane 0.1 mg-20 mcg tablet  TAKE 1 TABLET BY MOUTH ONCE DAILY, Disp: , Rfl:    fluticasone (FLONASE) 50 MCG/ACT nasal spray, Place 2 sprays into both nostrils daily., Disp: 16 g, Rfl: 0   ibuprofen (ADVIL) 600 MG tablet, Take 1 tablet (600 mg total) by mouth every 6 (six) hours as needed., Disp: 30 tablet, Rfl: 0   Prenatal Vit-Iron Carbonyl-FA (PNV TABS 29-1) 29-1 MG TABS, Take 1 tablet by mouth daily. (Patient not taking: Reported on 12/23/2021), Disp: , Rfl:    rizatriptan (MAXALT) 10 MG tablet, Take 1 tablet (10 mg total) by mouth  as needed for migraine. May repeat in 2 hours if needed (Patient not taking: Reported on 12/23/2021), Disp: 10 tablet, Rfl: 0  No Known Allergies   ROS  As noted in HPI.   Physical Exam  BP 103/60 (BP Location: Left Arm)   Pulse 93   Temp 98 F (36.7 C) (Oral)   Resp 18   LMP 12/19/2021   SpO2 97%  *** Constitutional: Well developed, well nourished, no acute distress Eyes:  EOMI, conjunctiva normal bilaterally HENT: Normocephalic, atraumatic,mucus membranes moist Respiratory: Normal inspiratory effort Cardiovascular: Normal rate GI: nondistended skin: No rash, skin intact Musculoskeletal: no deformities Neurologic: Alert & oriented x 3, no focal neuro deficits Psychiatric: Speech and behavior appropriate   ED Course   Medications - No data to display  Orders Placed This Encounter  Procedures   HIV Antibody (routine testing w rflx)    Standing Status:   Standing    Number of Occurrences:   1   RPR    Standing Status:   Standing    Number of Occurrences:   1   Nursing Communication Please set up with a PCP prior to discharge    Please set up with a PCP prior to discharge    Standing Status:   Standing    Number of Occurrences:   1    No results found for this or any previous  visit (from the past 24 hour(s)). No results found.  ED Clinical Impression  1. Screening for STD (sexually transmitted disease)      ED Assessment/Plan   {The patient has been seen in Urgent Care in the last 3 years. :1}  Checking gonorrhea, chlamydia, trichomonas, BV, HIV, RPR.  Patient asymptomatic.  Will base further treatment off of labs.  Will have staff set patient up with a PCP prior to discharge, will also provide patient with a primary care list.  Discussed MDM, treatment plan, and plan for follow-up with {Blank single:19197::"family","parent","patient"}.  {Blank single:19197::"family","parent","patient"} agrees with plan.   No orders of the defined types were placed in  this encounter.     *This clinic note was created using Dragon dictation software. Therefore, there may be occasional mistakes despite careful proofreading.  ?

## 2021-12-26 LAB — CERVICOVAGINAL ANCILLARY ONLY
Bacterial Vaginitis (gardnerella): NEGATIVE
Chlamydia: NEGATIVE
Comment: NEGATIVE
Comment: NEGATIVE
Comment: NEGATIVE
Comment: NORMAL
Neisseria Gonorrhea: NEGATIVE
Trichomonas: NEGATIVE

## 2021-12-27 ENCOUNTER — Telehealth: Payer: Medicaid Other | Admitting: Physician Assistant

## 2021-12-27 DIAGNOSIS — R519 Headache, unspecified: Secondary | ICD-10-CM

## 2021-12-27 DIAGNOSIS — A599 Trichomoniasis, unspecified: Secondary | ICD-10-CM | POA: Diagnosis not present

## 2021-12-27 LAB — RPR: RPR Ser Ql: NONREACTIVE

## 2021-12-27 LAB — HIV ANTIBODY (ROUTINE TESTING W REFLEX): HIV Screen 4th Generation wRfx: NONREACTIVE

## 2021-12-27 MED ORDER — RIZATRIPTAN BENZOATE 10 MG PO TABS: 10.0000 mg | ORAL_TABLET | ORAL | 0 refills | Status: AC | PRN

## 2021-12-27 MED ORDER — METRONIDAZOLE 500 MG PO TABS: 500.0000 mg | ORAL_TABLET | Freq: Two times a day (BID) | ORAL | 0 refills | Status: AC

## 2021-12-27 NOTE — Patient Instructions (Signed)
Kristen Gomez, thank you for joining Mar Daring, PA-C for today's virtual visit.  While this provider is not your primary care provider (PCP), if your PCP is located in our provider database this encounter information will be shared with them immediately following your visit.   [place] account gives you access to today's visit and all your visits, tests, and labs performed at [facility] " click here if you don't have a [facility] account or go to [email or web address]  Consent: (Patient) Kristen Gomez provided verbal consent for this virtual visit at the beginning of the encounter.  Current Medications:  Current Outpatient Medications:    metroNIDAZOLE (FLAGYL) 500 MG tablet, Take 1 tablet (500 mg total) by mouth 2 (two) times daily for 7 days., Disp: 14 tablet, Rfl: 0   fluticasone (FLONASE) 50 MCG/ACT nasal spray, Place 2 sprays into both nostrils daily., Disp: 16 g, Rfl: 0   ibuprofen (ADVIL) 600 MG tablet, Take 1 tablet (600 mg total) by mouth every 6 (six) hours as needed., Disp: 30 tablet, Rfl: 0   levonorgestrel-ethinyl estradiol (AVIANE) 0.1-20 MG-MCG tablet, Aviane 0.1 mg-20 mcg tablet  TAKE 1 TABLET BY MOUTH ONCE DAILY, Disp: , Rfl:    Prenatal Vit-Iron Carbonyl-FA (PNV TABS 29-1) 29-1 MG TABS, Take 1 tablet by mouth daily. (Patient not taking: Reported on 12/23/2021), Disp: , Rfl:    rizatriptan (MAXALT) 10 MG tablet, Take 1 tablet (10 mg total) by mouth as needed for migraine. May repeat in 2 hours if needed, Disp: 10 tablet, Rfl: 0   Medications ordered in this encounter:  Meds ordered this encounter  Medications   metroNIDAZOLE (FLAGYL) 500 MG tablet    Sig: Take 1 tablet (500 mg total) by mouth 2 (two) times daily for 7 days.    Dispense:  14 tablet    Refill:  0    Order Specific Question:   Supervising Provider    Answer:   Chase Picket [id number]   rizatriptan (MAXALT) 10 MG tablet    Sig: Take 1 tablet  (10 mg total) by mouth as needed for migraine. May repeat in 2 hours if needed    Dispense:  10 tablet    Refill:  0    Order Specific Question:   Supervising Provider    Answer:   Chase Picket [id number]     *If you need refills on other medications prior to your next appointment, please contact your pharmacy*  Follow-Up: Call back or seek an in-person evaluation if the symptoms worsen or if the condition fails to improve as anticipated.  [place] [phone]  Other Instructions  Migraine Headache A migraine headache is a very strong throbbing pain on one side or both sides of your head. This type of headache can also cause other symptoms. It can last from 4 hours to 3 days. Talk with your doctor about what things may bring on (trigger) this condition. What are the causes? The exact cause of this condition is not known. This condition may be triggered or caused by: Drinking alcohol. Smoking. Taking medicines, such as: Medicine used to treat chest pain (nitroglycerin). Birth control pills. Estrogen. Some blood pressure medicines. Eating or drinking certain products. Doing physical activity. Other things that may trigger a migraine headache include: Having a menstrual period. Pregnancy. Hunger. Stress. Not getting enough sleep or getting too much sleep. Weather changes. Tiredness (fatigue). What increases the risk? Being 28-58 years old. Being  female. Having a family history of migraine headaches. Being Caucasian. Having depression or anxiety. Being very overweight. What are the signs or symptoms? A throbbing pain. This pain may: Happen in any area of the head, such as on one side or both sides. Make it hard to do daily activities. Get worse with physical activity. Get worse around bright lights or loud noises. Other symptoms may include: Feeling sick to your stomach (nauseous). Vomiting. Dizziness. Being sensitive to bright lights, loud  noises, or smells. Before you get a migraine headache, you may get warning signs (an aura). An aura may include: Seeing flashing lights or having blind spots. Seeing bright spots, halos, or zigzag lines. Having tunnel vision or blurred vision. Having numbness or a tingling feeling. Having trouble talking. Having weak muscles. Some people have symptoms after a migraine headache (postdromal phase), such as: Tiredness. Trouble thinking (concentrating). How is this treated? Taking medicines that: Relieve pain. Relieve the feeling of being sick to your stomach. Prevent migraine headaches. Treatment may also include: Having acupuncture. Avoiding foods that bring on migraine headaches. Learning ways to control your body functions (biofeedback). Therapy to help you know and deal with negative thoughts (cognitive behavioral therapy). Follow these instructions at home: Medicines Take over-the-counter and prescription medicines only as told by your doctor. Ask your doctor if the medicine prescribed to you: Requires you to avoid driving or using heavy machinery. Can cause trouble pooping (constipation). You may need to take these steps to prevent or treat trouble pooping: Drink enough fluid to keep your pee (urine) pale yellow. Take over-the-counter or prescription medicines. Eat foods that are high in fiber. These include beans, whole grains, and fresh fruits and vegetables. Limit foods that are high in fat and sugar. These include fried or sweet foods. Lifestyle Do not drink alcohol. Do not use any products that contain nicotine or tobacco, such as cigarettes, e-cigarettes, and chewing tobacco. If you need help quitting, ask your doctor. Get at least 8 hours of sleep every night. Limit and deal with stress. General instructions Keep a journal to find out what may bring on your migraine headaches. For example, write down: What you eat and drink. How much sleep you get. Any change in what  you eat or drink. Any change in your medicines. If you have a migraine headache: Avoid things that make your symptoms worse, such as bright lights. It may help to lie down in a dark, quiet room. Do not drive or use heavy machinery. Ask your doctor what activities are safe for you. Keep all follow-up visits as told by your doctor. This is important. Contact a doctor if: You get a migraine headache that is different or worse than others you have had. You have more than 15 headache days in one month. Get help right away if: Your migraine headache gets very bad. Your migraine headache lasts longer than 72 hours. You have a fever. You have a stiff neck. You have trouble seeing. Your muscles feel weak or like you cannot control them. You start to lose your balance a lot. You start to have trouble walking. You pass out (faint). You have a seizure. Summary A migraine headache is a very strong throbbing pain on one side or both sides of your head. These headaches can also cause other symptoms. This condition may be treated with medicines and changes to your lifestyle. Keep a journal to find out what may bring on your migraine headaches. Contact a doctor if you get a  migraine headache that is different or worse than others you have had. Contact your doctor if you have more than 15 headache days in a month. This information is not intended to replace advice given to you by your health care provider. Make sure you discuss any questions you have with your health care provider. Document Revised: 06/16/2021 Document Reviewed: 02/14/2018 Elsevier Patient Education  2023 Elsevier Inc.   Trichomoniasis Trichomoniasis is a sexually transmitted infection (STI). Many people with trichomoniasis do not have any symptoms (are asymptomatic) or have only minimal symptoms. Untreated trichomoniasis can last from months to years. This condition is treated with medicine. What are the causes? This condition is  caused by a parasite called Trichomonas vaginalis and is transmitted during sexual contact. What increases the risk? The following factors may make you more likely to develop this condition: Having unprotected sex. Having sex with a partner who has trichomoniasis. Having multiple sexual partners. Having had previous trichomoniasis infections or other STIs. What are the signs or symptoms? In females, symptoms of trichomoniasis include: Itching, burning, redness, or soreness in the genital area. Discomfort while urinating. Abnormal vaginal discharge that is clear, white, gray, or yellow-green and foamy and has an unusual fishy odor. In males, symptoms of trichomoniasis include: Discharge from the penis. Burning after urination or ejaculation. Itching or discomfort inside the penis. How is this diagnosed? This condition is diagnosed based on tests. To perform a test, your health care provider will do one of the following: Ask you to provide a urine sample. Take a sample of discharge. The sample may be taken from the vagina or cervix in females and from the urethra in males. Your health care provider may use a swab to collect the sample. Your health care provider may test you for other STIs, including human immunodeficiency virus (HIV). How is this treated?  This condition is treated with medicines such as metronidazole or tinidazole. These are called antimicrobial medicines, and they are taken by mouth (orally). Your sexual partner or partners also need to be tested and treated. If they have the infection and are not treated, you will likely get reinfected. If you plan to become pregnant or think you may be pregnant, tell your health care provider right away. Some medicines that are used to treat the infection should not be taken during pregnancy. Your health care provider may recommend over-the-counter medicines or creams to help relieve itching or irritation. You may be tested for the  infection again 3 months after treatment. Follow these instructions at home: Medicines Take over-the-counter and prescription medicines only as told by your health care provider. Take your antimicrobial medicine as told by your health care provider. Do not stop taking it even if you start to feel better. Use creams as told by your health care provider. General instructions Do not have sex until after you finish your medicine and your symptoms have resolved. Do not wear tampons while you have the infection (if you are female). Talk with your sexual partner or partners about any symptoms that either of you may have, as well as any history of STIs. Keep all follow-up visits. This is important. How is this prevented?  Use condoms every time you have sex. Using condoms correctly and consistently can help protect against STIs. Do not have sexual contact if you have symptoms of trichomoniasis or another STI. Avoid having multiple sexual partners. Get tested for STIs before you have sex with a partner. Ask all partners to do the same.  Do not douche (if you are female). Douching may increase your risk for getting STIs due to the removal of good bacteria in the vagina. Contact a health care provider if: You still have symptoms after you finish your medicine. You develop a rash. You plan to become pregnant or think you may be pregnant. Summary Trichomoniasis is a sexually transmitted infection (STI). This condition often has no symptoms or minimal symptoms. Take your antimicrobial medicine as told by your health care provider. Do not stop taking even if you start to feel better. Discuss your infection with your sexual partner or partners. Make sure that all partners get tested and treated, if necessary. You should not have sex until after you finish your medicine and your symptoms have resolved. Keep all follow-up visits. This is important. This information is not intended to replace advice given to  you by your health care provider. Make sure you discuss any questions you have with your health care provider. Document Revised: 12/01/2020 Document Reviewed: 12/01/2020 Elsevier Patient Education  [place].    If you have been instructed to have an in-person evaluation today at a local Urgent Care facility, please use the link below. It will take you to a list of all of our available [facility], including address, phone number and hours of operation. Please do not delay care.  [facility]  If you or a family member do not have a primary care provider, use the link below to schedule a visit and establish care. When you choose a [facility] primary care physician or advanced practice provider, you gain a long-term partner in health. Find a Primary Care Provider  Learn more about [facility]'s in-office and virtual care options: [facility]

## 2021-12-27 NOTE — Progress Notes (Signed)
Virtual Visit Consent   Kristen Gomez, you are scheduled for a virtual visit with a [facility] provider today. Just as with appointments in the office, your consent must be obtained to participate. Your consent will be active for this visit and any virtual visit you may have with one of our providers in the next 365 days. If you have a MyChart account, a copy of this consent can be sent to you electronically.  As this is a virtual visit, video technology does not allow for your provider to perform a traditional examination. This may limit your provider's ability to fully assess your condition. If your provider identifies any concerns that need to be evaluated in person or the need to arrange testing (such as labs, EKG, etc.), we will make arrangements to do so. Although advances in technology are sophisticated, we cannot ensure that it will always work on either your end or our end. If the connection with a video visit is poor, the visit may have to be switched to a telephone visit. With either a video or telephone visit, we are not always able to ensure that we have a secure connection.  By engaging in this virtual visit, you consent to the provision of healthcare and authorize for your insurance to be billed (if applicable) for the services provided during this visit. Depending on your insurance coverage, you may receive a charge related to this service.  I need to obtain your verbal consent now. Are you willing to proceed with your visit today? Kristen Gomez has provided verbal consent on 12/27/2021 for a virtual visit (video or telephone). Kristen Loveless, PA-C  Date: 12/27/2021 1:53 PM  Virtual Visit via Video Note   I, Kristen Gomez, connected with  Kristen Gomez  ([id number], Jun 01, 1995) on 12/27/21 at  1:45 PM EST by a video-enabled telemedicine application and verified that I am speaking with the correct person using two identifiers.  Location: Patient:  Virtual Visit Location Patient: Home Provider: Virtual Visit Location Provider: Home Office   I discussed the limitations of evaluation and management by telemedicine and the availability of in person appointments. The patient expressed understanding and agreed to proceed.    History of Present Illness: Kristen Gomez is a 26 y.o. who identifies as a female who was assigned female at birth, and is being seen today for a headache. Of recent last week had sinus symptoms that she treated with OTC medications and feels that is helping the sinus symptoms. The headaches have been off and on more frequently of recent, exacerbated last week, but now back to normal headaches. Having daily to every other day. Reports migrainous with right unilateral and sometimes bilateral frontal headaches. Has been trying naproxen, ibuprofen, tylenol, rest. Reports can get some relief but headaches come back. Does mention trying Maxalt last year and was able to break migraines for longer periods of time and had success with that.  Was seen in person on 12/23/21 for STI screening. Her testing returned completely normal. However, her husband did test positive for Trich. She does report she is having increased vaginal discharge.    Problems:  Patient Active Problem List   Diagnosis Date Noted   Status post repeat low transverse cesarean section 12/31/2017   History of cesarean section, low transverse 12/28/2017   S/P cesarean section 09/30/2014   Labor and delivery, indication for care 09/29/2014    Allergies: No Known Allergies Medications:  Current Outpatient Medications:  metroNIDAZOLE (FLAGYL) 500 MG tablet, Take 1 tablet (500 mg total) by mouth 2 (two) times daily for 7 days., Disp: 14 tablet, Rfl: 0   fluticasone (FLONASE) 50 MCG/ACT nasal spray, Place 2 sprays into both nostrils daily., Disp: 16 g, Rfl: 0   ibuprofen (ADVIL) 600 MG tablet, Take 1 tablet (600 mg total) by mouth every 6 (six) hours as  needed., Disp: 30 tablet, Rfl: 0   levonorgestrel-ethinyl estradiol (AVIANE) 0.1-20 MG-MCG tablet, Aviane 0.1 mg-20 mcg tablet  TAKE 1 TABLET BY MOUTH ONCE DAILY, Disp: , Rfl:    Prenatal Vit-Iron Carbonyl-FA (PNV TABS 29-1) 29-1 MG TABS, Take 1 tablet by mouth daily. (Patient not taking: Reported on 12/23/2021), Disp: , Rfl:    rizatriptan (MAXALT) 10 MG tablet, Take 1 tablet (10 mg total) by mouth as needed for migraine. May repeat in 2 hours if needed, Disp: 10 tablet, Rfl: 0  Observations/Objective: Patient is well-developed, well-nourished in no acute distress.  Resting comfortably at home.  Head is normocephalic, atraumatic.  No labored breathing.  Speech is clear and coherent with logical content.  Patient is alert and oriented at baseline.    Assessment and Plan: 1. Trichimoniasis - metroNIDAZOLE (FLAGYL) 500 MG tablet; Take 1 tablet (500 mg total) by mouth 2 (two) times daily for 7 days.  Dispense: 14 tablet; Refill: 0  2. Acute nonintractable headache, unspecified headache type - rizatriptan (MAXALT) 10 MG tablet; Take 1 tablet (10 mg total) by mouth as needed for migraine. May repeat in 2 hours if needed  Dispense: 10 tablet; Refill: 0  - Maxalt refilled to try for migrainous headaches again since had success in the past - Continue to push fluids  - Did exam husband's chart since testing was completed at local [facility] and did not only positive for Trichomonas - Will treat her with Metronidazole despite negative testing since she is having increased vaginal discharge now - Abstain from sexual activity until all parties are successfully treated - Seek in person care if symptoms worsen or fail to improve  Follow Up Instructions: I discussed the assessment and treatment plan with the patient. The patient was provided an opportunity to ask questions and all were answered. The patient agreed with the plan and demonstrated an understanding of the instructions.  A copy of  instructions were sent to the patient via MyChart unless otherwise noted below.    The patient was advised to call back or seek an in-person evaluation if the symptoms worsen or if the condition fails to improve as anticipated.  Time:  I spent 10 minutes with the patient via telehealth technology discussing the above problems/concerns.    Kristen Loveless, PA-C

## 2022-01-02 ENCOUNTER — Telehealth: Payer: Medicaid Other | Admitting: Family Medicine

## 2022-01-02 DIAGNOSIS — T3695XA Adverse effect of unspecified systemic antibiotic, initial encounter: Secondary | ICD-10-CM | POA: Diagnosis not present

## 2022-01-02 DIAGNOSIS — B379 Candidiasis, unspecified: Secondary | ICD-10-CM

## 2022-01-02 MED ORDER — FLUCONAZOLE 150 MG PO TABS: 150.0000 mg | ORAL_TABLET | ORAL | 0 refills | Status: AC

## 2022-01-02 NOTE — Patient Instructions (Signed)
Kristen Gomez, thank you for joining Freddy Finner, NP for today's virtual visit.  While this provider is not your primary care provider (PCP), if your PCP is located in our provider database this encounter information will be shared with them immediately following your visit.   A [facility] MyChart account gives you access to today's visit and all your visits, tests, and labs performed at [facility] " click here if you don't have a [facility] MyChart account or go to [email or web address]  Consent: (Patient) Kristen Gomez provided verbal consent for this virtual visit at the beginning of the encounter.  Current Medications:  Current Outpatient Medications:    fluconazole (DIFLUCAN) 150 MG tablet, Take 1 tablet (150 mg total) by mouth as directed. May repeat in 3 days., Disp: 2 tablet, Rfl: 0   fluticasone (FLONASE) 50 MCG/ACT nasal spray, Place 2 sprays into both nostrils daily., Disp: 16 g, Rfl: 0   ibuprofen (ADVIL) 600 MG tablet, Take 1 tablet (600 mg total) by mouth every 6 (six) hours as needed., Disp: 30 tablet, Rfl: 0   levonorgestrel-ethinyl estradiol (AVIANE) 0.1-20 MG-MCG tablet, Aviane 0.1 mg-20 mcg tablet  TAKE 1 TABLET BY MOUTH ONCE DAILY, Disp: , Rfl:    metroNIDAZOLE (FLAGYL) 500 MG tablet, Take 1 tablet (500 mg total) by mouth 2 (two) times daily for 7 days., Disp: 14 tablet, Rfl: 0   Prenatal Vit-Iron Carbonyl-FA (PNV TABS 29-1) 29-1 MG TABS, Take 1 tablet by mouth daily. (Patient not taking: Reported on 12/23/2021), Disp: , Rfl:    rizatriptan (MAXALT) 10 MG tablet, Take 1 tablet (10 mg total) by mouth as needed for migraine. May repeat in 2 hours if needed, Disp: 10 tablet, Rfl: 0   Medications ordered in this encounter:  Meds ordered this encounter  Medications   fluconazole (DIFLUCAN) 150 MG tablet    Sig: Take 1 tablet (150 mg total) by mouth as directed. May repeat in 3 days.    Dispense:  2 tablet    Refill:  0    Order Specific  Question:   Supervising Provider    Answer:   Merrilee Jansky [id number]     *If you need refills on other medications prior to your next appointment, please contact your pharmacy*  Follow-Up: Call back or seek an in-person evaluation if the symptoms worsen or if the condition fails to improve as anticipated.  [facility] [phone]  Other Instructions Vaginal Yeast Infection, Adult  Vaginal yeast infection is a condition that causes vaginal discharge as well as soreness, swelling, and redness (inflammation) of the vagina. This is a common condition. Some women get this infection frequently. What are the causes? This condition is caused by a change in the normal balance of the yeast (Candida) and normal bacteria that live in the vagina. This change causes an overgrowth of yeast, which causes the inflammation. What increases the risk? The condition is more likely to develop in women who: Take antibiotic medicines. Have diabetes. Take birth control pills. Are pregnant. Douche often. Have a weak body defense system (immune system). Have been taking steroid medicines for a long time. Frequently wear tight clothing. What are the signs or symptoms? Symptoms of this condition include: White, thick, creamy vaginal discharge. Swelling, itching, redness, and irritation of the vagina. The lips of the vagina (labia) may be affected as well. Pain or a burning feeling while urinating. Pain during sex. How is this diagnosed? This condition is diagnosed based  on: Your medical history. A physical exam. A pelvic exam. Your health care provider will examine a sample of your vaginal discharge under a microscope. Your health care provider may send this sample for testing to confirm the diagnosis. How is this treated? This condition is treated with medicine. Medicines may be over-the-counter or prescription. You may be told to use one or more of the following: Medicine that is  taken by mouth (orally). Medicine that is applied as a cream (topically). Medicine that is inserted directly into the vagina (suppository). Follow these instructions at home: Take or apply over-the-counter and prescription medicines only as told by your health care provider. Do not use tampons until your health care provider approves. Do not have sex until your infection has cleared. Sex can prolong or worsen your symptoms of infection. Ask your health care provider when it is safe to resume sexual activity. Keep all follow-up visits. This is important. How is this prevented?  Do not wear tight clothes, such as pantyhose or tight pants. Wear breathable cotton underwear. Do not use douches, perfumed soap, creams, or powders. Wipe from front to back after using the toilet. If you have diabetes, keep your blood sugar levels under control. Ask your health care provider for other ways to prevent yeast infections. Contact a health care provider if: You have a fever. Your symptoms go away and then return. Your symptoms do not get better with treatment. Your symptoms get worse. You have new symptoms. You develop blisters in or around your vagina. You have blood coming from your vagina and it is not your menstrual period. You develop pain in your abdomen. Summary Vaginal yeast infection is a condition that causes discharge as well as soreness, swelling, and redness (inflammation) of the vagina. This condition is treated with medicine. Medicines may be over-the-counter or prescription. Take or apply over-the-counter and prescription medicines only as told by your health care provider. Do not douche. Resume sexual activity or use of tampons as instructed by your health care provider. Contact a health care provider if your symptoms do not get better with treatment or your symptoms go away and then return. This information is not intended to replace advice given to you by your health care provider.  Make sure you discuss any questions you have with your health care provider. Document Revised: 03/22/2020 Document Reviewed: 03/22/2020 Elsevier Patient Education  2023 Elsevier Inc.   If you have been instructed to have an in-person evaluation today at a local Urgent Care facility, please use the link below. It will take you to a list of all of our available [facility], including address, phone number and hours of operation. Please do not delay care.  [facility]  If you or a family member do not have a primary care provider, use the link below to schedule a visit and establish care. When you choose a [facility] primary care physician or advanced practice provider, you gain a long-term partner in health. Find a Primary Care Provider  Learn more about [facility] in-office and virtual care options: [facility] - Get Care Now

## 2022-01-02 NOTE — Progress Notes (Signed)
Virtual Visit Consent   [name], you are scheduled for a virtual visit with a [facility] provider today. Just as with appointments in the office, your consent must be obtained to participate. Your consent will be active for this visit and any virtual visit you may have with one of our providers in the next 365 days. If you have a MyChart account, a copy of this consent can be sent to you electronically.  As this is a virtual visit, video technology does not allow for your provider to perform a traditional examination. This may limit your provider's ability to fully assess your condition. If your provider identifies any concerns that need to be evaluated in person or the need to arrange testing (such as labs, EKG, etc.), we will make arrangements to do so. Although advances in technology are sophisticated, we cannot ensure that it will always work on either your end or our end. If the connection with a video visit is poor, the visit may have to be switched to a telephone visit. With either a video or telephone visit, we are not always able to ensure that we have a secure connection.  By engaging in this virtual visit, you consent to the provision of healthcare and authorize for your insurance to be billed (if applicable) for the services provided during this visit. Depending on your insurance coverage, you may receive a charge related to this service.  I need to obtain your verbal consent now. Are you willing to proceed with your visit today? Kristen Gomez has provided verbal consent on 01/02/2022 for a virtual visit (video or telephone). Freddy Finner, NP  Date: 01/02/2022 9:00 AM  Virtual Visit via Video Note   I, Freddy Finner, connected with  Kristen Gomez  ([id number], 1995/03/22) on 01/02/22 at  9:00 AM EST by a video-enabled telemedicine application and verified that I am speaking with the correct person using two identifiers.  Location: Patient: Virtual Visit  Location Patient: Home Provider: Virtual Visit Location Provider: Home Office   I discussed the limitations of evaluation and management by telemedicine and the availability of in person appointments. The patient expressed understanding and agreed to proceed.    History of Present Illness: Kristen Gomez is a 26 y.o. who identifies as a female who was assigned female at birth, and is being seen today for   HPI: Vaginal Itching The patient's primary symptoms include genital itching. The patient's pertinent negatives include no genital lesions, genital odor, genital rash, missed menses, pelvic pain, vaginal bleeding or vaginal discharge. This is a new problem. The current episode started in the past 7 days. The problem occurs constantly. The problem has been gradually worsening. The patient is experiencing no pain. She is not pregnant. Pertinent negatives include no abdominal pain, anorexia, back pain, chills, constipation, diarrhea, discolored urine, dysuria, fever, flank pain, frequency, headaches, hematuria, joint pain, joint swelling, nausea, painful intercourse, rash, sore throat, urgency or vomiting. Nothing aggravates the symptoms. She has tried nothing for the symptoms. She is sexually active. She uses abstinence for contraception. Her menstrual history has been regular. Her past medical history is significant for vaginosis.    Problems:  Patient Active Problem List   Diagnosis Date Noted   Status post repeat low transverse cesarean section 12/31/2017   History of cesarean section, low transverse 12/28/2017   S/P cesarean section 09/30/2014   Labor and delivery, indication for care 09/29/2014    Allergies: No Known Allergies Medications:  Current Outpatient Medications:    fluticasone (FLONASE) 50 MCG/ACT nasal spray, Place 2 sprays into both nostrils daily., Disp: 16 g, Rfl: 0   ibuprofen (ADVIL) 600 MG tablet, Take 1 tablet (600 mg total) by mouth every 6 (six) hours as needed.,  Disp: 30 tablet, Rfl: 0   levonorgestrel-ethinyl estradiol (AVIANE) 0.1-20 MG-MCG tablet, Aviane 0.1 mg-20 mcg tablet  TAKE 1 TABLET BY MOUTH ONCE DAILY, Disp: , Rfl:    metroNIDAZOLE (FLAGYL) 500 MG tablet, Take 1 tablet (500 mg total) by mouth 2 (two) times daily for 7 days., Disp: 14 tablet, Rfl: 0   Prenatal Vit-Iron Carbonyl-FA (PNV TABS 29-1) 29-1 MG TABS, Take 1 tablet by mouth daily. (Patient not taking: Reported on 12/23/2021), Disp: , Rfl:    rizatriptan (MAXALT) 10 MG tablet, Take 1 tablet (10 mg total) by mouth as needed for migraine. May repeat in 2 hours if needed, Disp: 10 tablet, Rfl: 0  Observations/Objective: Patient is well-developed, well-nourished in no acute distress.  Resting comfortably  at home.  Head is normocephalic, atraumatic.  No labored breathing.  Speech is clear and coherent with logical content.  Patient is alert and oriented at baseline.    Assessment and Plan:   1. Antibiotic-induced yeast infection  - fluconazole (DIFLUCAN) 150 MG tablet; Take 1 tablet (150 mg total) by mouth as directed. May repeat in 3 days.  Dispense: 2 tablet; Refill: 0   -review of causes and prevention -complete current Flagyl and take above as directed -hydration -good hygiene  Follow up if not improved   Reviewed side effects, risks and benefits of medication.    Patient acknowledged agreement and understanding of the plan.   Past Medical, Surgical, Social History, Allergies, and Medications have been Reviewed.    Follow Up Instructions: I discussed the assessment and treatment plan with the patient. The patient was provided an opportunity to ask questions and all were answered. The patient agreed with the plan and demonstrated an understanding of the instructions.  A copy of instructions were sent to the patient via MyChart unless otherwise noted below.     The patient was advised to call back or seek an in-person evaluation if the symptoms worsen or if the  condition fails to improve as anticipated.  Time:  I spent 8 minutes with the patient via telehealth technology discussing the above problems/concerns.    Freddy Finner, NP

## 2022-01-22 ENCOUNTER — Emergency Department (HOSPITAL_BASED_OUTPATIENT_CLINIC_OR_DEPARTMENT_OTHER)
Admission: EM | Admit: 2022-01-22 | Discharge: 2022-01-22 | Disposition: A | Discharge status: Home / Self Care | Payer: Medicaid Other | Attending: Emergency Medicine | Admitting: Emergency Medicine

## 2022-01-22 ENCOUNTER — Other Ambulatory Visit: Payer: Self-pay

## 2022-01-22 ENCOUNTER — Emergency Department (HOSPITAL_BASED_OUTPATIENT_CLINIC_OR_DEPARTMENT_OTHER): Payer: Medicaid Other

## 2022-01-22 ENCOUNTER — Encounter (HOSPITAL_BASED_OUTPATIENT_CLINIC_OR_DEPARTMENT_OTHER): Payer: Self-pay | Admitting: Emergency Medicine

## 2022-01-22 ENCOUNTER — Emergency Department (HOSPITAL_BASED_OUTPATIENT_CLINIC_OR_DEPARTMENT_OTHER): Payer: Medicaid Other | Admitting: Radiology

## 2022-01-22 DIAGNOSIS — R059 Cough, unspecified: Secondary | ICD-10-CM | POA: Insufficient documentation

## 2022-01-22 DIAGNOSIS — R058 Other specified cough: Secondary | ICD-10-CM

## 2022-01-22 DIAGNOSIS — Z1152 Encounter for screening for COVID-19: Secondary | ICD-10-CM | POA: Diagnosis not present

## 2022-01-22 DIAGNOSIS — R0789 Other chest pain: Secondary | ICD-10-CM | POA: Diagnosis present

## 2022-01-22 LAB — CBC
HCT: 36.8 % (ref 36.0–46.0)
Hemoglobin: 12.3 g/dL (ref 12.0–15.0)
MCH: 29.4 pg (ref 26.0–34.0)
MCHC: 33.4 g/dL (ref 30.0–36.0)
MCV: 87.8 fL (ref 80.0–100.0)
Platelets: 442 10*3/uL — ABNORMAL HIGH (ref 150–400)
RBC: 4.19 MIL/uL (ref 3.87–5.11)
RDW: 12.7 % (ref 11.5–15.5)
WBC: 6.2 10*3/uL (ref 4.0–10.5)
nRBC: 0 % (ref 0.0–0.2)

## 2022-01-22 LAB — TROPONIN I (HIGH SENSITIVITY)
Troponin I (High Sensitivity): <2 ng/L (ref <18)
Troponin I (High Sensitivity): <2 ng/L (ref <18)

## 2022-01-22 LAB — BASIC METABOLIC PANEL
Anion gap: 10 (ref 5–15)
BUN: <5 mg/dL — ABNORMAL LOW (ref 6–20)
CO2: 25 mmol/L (ref 22–32)
Calcium: 9.6 mg/dL (ref 8.9–10.3)
Chloride: 104 mmol/L (ref 98–111)
Creatinine, Ser: 0.64 mg/dL (ref 0.44–1.00)
GFR, Estimated: >60 mL/min (ref >60)
Glucose, Bld: 88 mg/dL (ref 70–99)
Potassium: 3.5 mmol/L (ref 3.5–5.1)
Sodium: 139 mmol/L (ref 135–145)

## 2022-01-22 LAB — RESP PANEL BY RT-PCR (RSV, FLU A&B, COVID)  RVPGX2
Influenza A by PCR: NEGATIVE
Influenza B by PCR: NEGATIVE
Resp Syncytial Virus by PCR: NEGATIVE
SARS Coronavirus 2 by RT PCR: NEGATIVE

## 2022-01-22 LAB — PREGNANCY, URINE: Preg Test, Ur: NEGATIVE

## 2022-01-22 MED ORDER — BENZONATATE 100 MG PO CAPS
200.0000 mg | ORAL_CAPSULE | Freq: Once | ORAL | Status: AC
  Administered 2022-01-22: 200 mg via ORAL
  Filled 2022-01-22: qty 2

## 2022-01-22 MED ORDER — KETOROLAC TROMETHAMINE 30 MG/ML IJ SOLN
30.0000 mg | Freq: Once | INTRAMUSCULAR | Status: AC
  Administered 2022-01-22: 30 mg via INTRAVENOUS
  Filled 2022-01-22: qty 1

## 2022-01-22 MED ORDER — HYDROCODONE-ACETAMINOPHEN 5-325 MG PO TABS
2.0000 | ORAL_TABLET | Freq: Once | ORAL | Status: AC
  Administered 2022-01-22: 2 via ORAL
  Filled 2022-01-22: qty 2

## 2022-01-22 MED ORDER — BENZONATATE 100 MG PO CAPS: 100.0000 mg | ORAL_CAPSULE | Freq: Three times a day (TID) | ORAL | 0 refills | Status: AC

## 2022-01-22 MED ORDER — NAPROXEN 500 MG PO TABS: 500.0000 mg | ORAL_TABLET | Freq: Two times a day (BID) | ORAL | 0 refills | Status: AC

## 2022-01-22 NOTE — Discharge Instructions (Addendum)
You were seen in the emergency department today and diagnosed with pleuritis. A prescription for indomethacin and tessalon have been sent to your pharmacy for pain control and cough. If you have worsening chest pain or a sudden difficulty breathing, please come into the ED for further evaluation.

## 2022-01-22 NOTE — ED Triage Notes (Signed)
Chest pain, flu on NYE,still has lingering cough, central chest sharp pain. Started this am.

## 2022-01-22 NOTE — ED Notes (Signed)
Discharge paperwork given and verbally understood. 

## 2022-01-22 NOTE — ED Provider Notes (Signed)
 [facility] EMERGENCY DEPT Provider Note   CSN: [id number] Arrival date & time: 01/22/22  1341     History Chief Complaint  Patient presents with   Chest Pain    Kristen Gomez is a 27 y.o. female.   Chest Pain Associated symptoms: cough   Associated symptoms: no fatigue, no fever, no nausea, no shortness of breath and no vomiting   Patient presents the emergency department several days of chest pain.  She reports symptoms of influenza around [place] Eve due to known sick contact.  Patient reports that she sounds a lingering cough which is resulting in centralized sharp chest pain.  She also reports associated difficulty breathing due to pain with deep inhalation.  Patient denies chest pain or shortness of breath at rest.  Has tried over-the-counter antitussive medications without much improvement in symptoms.    Home Medications Prior to Admission medications   Medication Sig Start Date End Date Taking? Authorizing Provider  benzonatate (TESSALON) 100 MG capsule Take 1 capsule (100 mg total) by mouth every 8 (eight) hours. 01/22/22  Yes Smitty Knudsen, PA-C  naproxen (NAPROSYN) 500 MG tablet Take 1 tablet (500 mg total) by mouth 2 (two) times daily with a meal for 15 days. 01/22/22 02/06/22 Yes Smitty Knudsen, PA-C  fluconazole (DIFLUCAN) 150 MG tablet Take 1 tablet (150 mg total) by mouth as directed. May repeat in 3 days. 01/02/22   Freddy Finner, NP  fluticasone (FLONASE) 50 MCG/ACT nasal spray Place 2 sprays into both nostrils daily. 11/02/20   Waldon Merl, PA-C  ibuprofen (ADVIL) 600 MG tablet Take 1 tablet (600 mg total) by mouth every 6 (six) hours as needed. 01/12/21   LampteyBritta Mccreedy, MD  levonorgestrel-ethinyl estradiol (AVIANE) 0.1-20 MG-MCG tablet Aviane 0.1 mg-20 mcg tablet  TAKE 1 TABLET BY MOUTH ONCE DAILY    [provider]  Prenatal Vit-Iron Carbonyl-FA (PNV TABS 29-1) 29-1 MG TABS Take 1 tablet by mouth daily. Patient not  taking: Reported on 12/23/2021 06/23/19   [provider]  rizatriptan (MAXALT) 10 MG tablet Take 1 tablet (10 mg total) by mouth as needed for migraine. May repeat in 2 hours if needed 12/27/21   [name], PA-C      Allergies    Patient has no known allergies.    Review of Systems   Review of Systems  Constitutional:  Negative for chills, fatigue and fever.  Respiratory:  Positive for cough and chest tightness. Negative for shortness of breath and wheezing.   Cardiovascular:  Positive for chest pain.  Gastrointestinal:  Negative for diarrhea, nausea and vomiting.    Physical Exam Updated Vital Signs BP 104/78   Pulse 82   Temp 98.2 F (36.8 C) (Oral)   Resp (!) 22   LMP 12/19/2021   SpO2 100%  Physical Exam Vitals and nursing note reviewed.  Constitutional:      Appearance: She is well-developed.  HENT:     Head: Normocephalic and atraumatic.  Eyes:     Extraocular Movements: Extraocular movements intact.  Cardiovascular:     Rate and Rhythm: Normal rate and regular rhythm. No extrasystoles are present.    Heart sounds: Normal heart sounds. Heart sounds not distant. No murmur heard.    No friction rub.  Pulmonary:     Effort: Pulmonary effort is normal. No tachypnea, accessory muscle usage or respiratory distress.     Breath sounds: Normal breath sounds. No stridor. No decreased breath sounds  or wheezing.  Chest:     Chest wall: Tenderness present.  Abdominal:     Palpations: Abdomen is soft.  Musculoskeletal:     Cervical back: Normal range of motion.  Skin:    General: Skin is warm and dry.     Capillary Refill: Capillary refill takes less than 2 seconds.  Neurological:     General: No focal deficit present.     Mental Status: She is alert.  Psychiatric:        Mood and Affect: Mood normal.     ED Results / Procedures / Treatments   Labs (all labs ordered are listed, but only abnormal results are displayed) Labs Reviewed  BASIC  METABOLIC PANEL - Abnormal; Notable for the following components:      Result Value   BUN <5 (*)    All other components within normal limits  CBC - Abnormal; Notable for the following components:   Platelets 442 (*)    All other components within normal limits  RESP PANEL BY RT-PCR (RSV, FLU A&B, COVID)  RVPGX2  PREGNANCY, URINE  TROPONIN I (HIGH SENSITIVITY)  TROPONIN I (HIGH SENSITIVITY)    EKG EKG Interpretation  Date/Time:  Sunday January 22 2022 13:52:03 EST Ventricular Rate:  86 PR Interval:  146 QRS Duration: 78 QT Interval:  372 QTC Calculation: 445 R Axis:   39 Text Interpretation: Normal sinus rhythm with sinus arrhythmia No significant change since last tracing Confirmed by Margarita Grizzle [phone]) on 01/22/2022 2:00:02 PM  Radiology DG Chest 2 View  Result Date: 01/22/2022 CLINICAL DATA:  Chest pain. EXAM: CHEST - 2 VIEW COMPARISON:  January 22, 2022 FINDINGS: No pneumothorax. The heart, hila, and mediastinum are normal. No pulmonary nodules or masses. No focal infiltrates. IMPRESSION: No active cardiopulmonary disease.  No pulmonary nodules. Electronically Signed   By: Gerome Sam III M.D.   On: 01/22/2022 16:03   DG Chest Port 1 View  Result Date: 01/22/2022 CLINICAL DATA:  Chest pain.  Cough. EXAM: PORTABLE CHEST 1 VIEW COMPARISON:  May 11, 2015 FINDINGS: No pneumothorax. The cardiomediastinal silhouette is normal. The right lung is clear. A nodular density projected over the left base may represent a nipple shadow or confluence of shadows. No other abnormalities on the left. IMPRESSION: 1. A nodular density projected over the left base may represent a nipple shadow or confluence of shadows. Recommend repeat PA and lateral imaging with nipple markers. 2. No other abnormalities. Electronically Signed   By: Gerome Sam III M.D.   On: 01/22/2022 15:16    Procedures Procedures   Medications Ordered in ED Medications  benzonatate (TESSALON) capsule 200 mg (200 mg  Oral Given 01/22/22 1600)  ketorolac (TORADOL) 30 MG/ML injection 30 mg (30 mg Intravenous Given 01/22/22 1618)  HYDROcodone-acetaminophen (NORCO/VICODIN) 5-325 MG per tablet 2 tablet (2 tablets Oral Given 01/22/22 1752)    ED Course/ Medical Decision Making/ A&P Clinical Course as of 01/22/22 1918  Sun Jan 22, 2022  1529 DG Chest [place] 1 View [OZ]    Clinical Course User Index [OZ] Smitty Knudsen, PA-C                           Medical Decision Making Amount and/or Complexity of Data Reviewed Labs: ordered. Radiology: ordered. Decision-making details documented in ED Course.  Risk Prescription drug management.   This patient presents to the ED for concern of chest pain.  Differential diagnosis includes ACS, viral  URI, pleurisy, chest trauma   Lab Tests:  I Ordered, and personally interpreted labs.  The pertinent results include: Negative troponin, negative respiratory viral panel, no leukocytosis   Imaging Studies ordered:  I ordered imaging studies including chest x-ray I independently visualized and interpreted imaging which showed no active cardiopulmonary disease I agree with the radiologist interpretation   Medicines ordered and prescription drug management:  I ordered medication including Tessalon, Toradol, Norco for cough and pain Reevaluation of the patient after these medicines showed that the patient improved I have reviewed the patients home medicines and have made adjustments as needed   Problem List / ED Course:  Patient presented to the emergency department complaints of chest pain.  She stated that she was sick with influenza around [place] Years and has had a lingering cough since that time. Stated that pain was typically worse with cough but present at other times as well. Cardiac workup was reassuring without evidence of ACS and presentation most consistent with pleuritic chest pain likely secondary to post-viral cough syndrome. Patient reported improvement in  pain and cough with ED medications and advised patient that she should attempt to manage cough outside of the ED to reduce the risk of returning due to pain. Patient verbalized understanding treatment and was agreeable to discharge home. Patient did not have any further questions at the end of the assessment.   Final Clinical Impression(s) / ED Diagnoses Final diagnoses:  Post-viral cough syndrome  Chest wall pain    Rx / DC Orders ED Discharge Orders          Ordered    benzonatate (TESSALON) 100 MG capsule  Every 8 hours        01/22/22 1821    naproxen (NAPROSYN) 500 MG tablet  2 times daily with meals        01/22/22 1821              Smitty Knudsen, PA-C 01/22/22 1918    Arby Barrette, MD 01/25/22 1650

## 2022-01-22 NOTE — ED Notes (Signed)
Kristen Gomez is being held due to Pt not currently having a Preg test... Urine sent for test.

## 2022-01-22 NOTE — ED Notes (Signed)
Pt aware of the need for a urine... Unable to currently urinate. 

## 2022-07-21 ENCOUNTER — Other Ambulatory Visit: Payer: Self-pay

## 2022-07-21 ENCOUNTER — Encounter (HOSPITAL_BASED_OUTPATIENT_CLINIC_OR_DEPARTMENT_OTHER): Payer: Self-pay | Admitting: Emergency Medicine

## 2022-07-21 ENCOUNTER — Emergency Department (HOSPITAL_BASED_OUTPATIENT_CLINIC_OR_DEPARTMENT_OTHER)
Admission: EM | Admit: 2022-07-21 | Discharge: 2022-07-21 | Disposition: A | Discharge status: Home / Self Care | Payer: Medicaid Other | Source: Home / Self Care | Attending: Emergency Medicine | Admitting: Emergency Medicine

## 2022-07-21 ENCOUNTER — Emergency Department (HOSPITAL_BASED_OUTPATIENT_CLINIC_OR_DEPARTMENT_OTHER): Payer: Medicaid Other | Admitting: Radiology

## 2022-07-21 DIAGNOSIS — M25562 Pain in left knee: Secondary | ICD-10-CM

## 2022-07-21 MED ORDER — KETOROLAC TROMETHAMINE 30 MG/ML IJ SOLN
30.0000 mg | Freq: Once | INTRAMUSCULAR | Status: AC
  Administered 2022-07-21: 30 mg via INTRAMUSCULAR
  Filled 2022-07-21: qty 1

## 2022-07-21 NOTE — ED Triage Notes (Signed)
Pt presents to ED POV. Pt c/o L knee pain since yesterday. Pt reports that she thinks she twisted her knee getting out of the pool yesterday but unsure. Pt reports knee has been swollen and very painful today.

## 2022-07-21 NOTE — Discharge Instructions (Addendum)
As discussed, your imaging shows no breaks or dislocations.  Your pain may be due to the ligaments within the joint.  Take ibuprofen every 4 hours as needed for inflammation, it may not help as much with pain but it will help reduce the swelling.  Utilize RICE therapy, information in the discharge summary provided on how to do it effectively.  Information for orthopedics provided to follow-up with if pain persists. Call on Monday to schedule an appointment.  Get help right away if: Your knee swells, and the swelling gets worse. You cannot move your knee. You have very bad knee pain that does not get better with pain medicine.

## 2022-07-21 NOTE — ED Notes (Signed)
Pt ambulated to the bathroom without assistance. 

## 2022-07-21 NOTE — ED Provider Notes (Signed)
[place] EMERGENCY DEPARTMENT AT [facility] Provider Note   CSN: [id number] Arrival date & time: 07/21/22  1757     History {Add pertinent medical, surgical, social history, OB history to HPI:1} Chief Complaint  Patient presents with   Knee Pain    Kristen Gomez is a 27 y.o. female with no significant past medical history who presents the ED today with left knee pain.  Patient reports that she was on her feet a lot yesterday and noticed her left knee started hurting while she was walking.  She does not remember any aggravating incidents prior to the onset of pain. The pain is improved when she is not weight-bearing and the knee is extended. She reports intermittent numbness and tingling down the leg that is self-limited.  Patient has a history left knee pain after twisting her knee several years prior but had improvement after the initial incident.  No other complaints or concerns at this time.    Home Medications Prior to Admission medications   Medication Sig Start Date End Date Taking? Authorizing Provider  benzonatate (TESSALON) 100 MG capsule Take 1 capsule (100 mg total) by mouth every 8 (eight) hours. 01/22/22   Smitty Knudsen, PA-C  fluconazole (DIFLUCAN) 150 MG tablet Take 1 tablet (150 mg total) by mouth as directed. May repeat in 3 days. 01/02/22   Freddy Finner, NP  fluticasone (FLONASE) 50 MCG/ACT nasal spray Place 2 sprays into both nostrils daily. 11/02/20   Waldon Merl, PA-C  ibuprofen (ADVIL) 600 MG tablet Take 1 tablet (600 mg total) by mouth every 6 (six) hours as needed. 01/12/21   LampteyBritta Mccreedy, MD  levonorgestrel-ethinyl estradiol (AVIANE) 0.1-20 MG-MCG tablet Aviane 0.1 mg-20 mcg tablet  TAKE 1 TABLET BY MOUTH ONCE DAILY    [provider]  Prenatal Vit-Iron Carbonyl-FA (PNV TABS 29-1) 29-1 MG TABS Take 1 tablet by mouth daily. Patient not taking: Reported on 12/23/2021 06/23/19   [provider]  rizatriptan (MAXALT) 10  MG tablet Take 1 tablet (10 mg total) by mouth as needed for migraine. May repeat in 2 hours if needed 12/27/21   [name], PA-C      Allergies    Patient has no known allergies.    Review of Systems   Review of Systems  Musculoskeletal:  Positive for joint swelling.       Left knee pain  All other systems reviewed and are negative.   Physical Exam Updated Vital Signs BP 117/64 (BP Location: Right Arm)   Pulse 66   Temp 98.3 F (36.8 C)   Resp 20   SpO2 100%  Physical Exam Vitals and nursing note reviewed.  Constitutional:      Appearance: Normal appearance.  HENT:     Head: Normocephalic and atraumatic.     Mouth/Throat:     Mouth: Mucous membranes are moist.  Eyes:     Conjunctiva/sclera: Conjunctivae normal.     Pupils: Pupils are equal, round, and reactive to light.  Cardiovascular:     Rate and Rhythm: Normal rate and regular rhythm.     Pulses: Normal pulses.     Heart sounds: Normal heart sounds.  Pulmonary:     Effort: Pulmonary effort is normal.     Breath sounds: Normal breath sounds.  Abdominal:     Palpations: Abdomen is soft.     Tenderness: There is no abdominal tenderness.  Musculoskeletal:        General: Swelling and  tenderness present.     Comments: Swelling and tenderness at the lateral left knee  Skin:    General: Skin is warm and dry.     Capillary Refill: Capillary refill takes less than 2 seconds.     Findings: No erythema or rash.  Neurological:     General: No focal deficit present.     Mental Status: She is alert.  Psychiatric:        Mood and Affect: Mood normal.        Behavior: Behavior normal.     ED Results / Procedures / Treatments   Labs (all labs ordered are listed, but only abnormal results are displayed) Labs Reviewed - No data to display  EKG None  Radiology DG Knee Complete 4 Views Left  Result Date: 07/21/2022 CLINICAL DATA:  Left knee pain since yesterday. Possible twisting injury. EXAM: LEFT KNEE  - COMPLETE 4+ VIEW COMPARISON:  None Available. FINDINGS: No evidence of fracture, dislocation, or joint effusion. No evidence of arthropathy or other focal bone abnormality. Soft tissues are unremarkable. IMPRESSION: No fracture or dislocation of the left knee. Electronically Signed   By: Narda Rutherford M.D.   On: 07/21/2022 19:19    Procedures Procedures: not indicated. {Document cardiac monitor, telemetry assessment procedure when appropriate:1}  Medications Ordered in ED Medications  ketorolac (TORADOL) 30 MG/ML injection 30 mg (30 mg Intramuscular Given 07/21/22 2229)    ED Course/ Medical Decision Making/ A&P   {   Click here for ABCD2, HEART and other calculatorsREFRESH Note before signing :1}                          Medical Decision Making Amount and/or Complexity of Data Reviewed Radiology: ordered.  Risk Prescription drug management.   ***  {Document critical care time when appropriate:1} {Document review of labs and clinical decision tools ie heart score, Chads2Vasc2 etc:1}  {Document your independent review of radiology images, and any outside records:1} {Document your discussion with family members, caretakers, and with consultants:1} {Document social determinants of health affecting pt's care:1} {Document your decision making why or why not admission, treatments were needed:1} Final Clinical Impression(s) / ED Diagnoses Final diagnoses:  Acute pain of left knee    Rx / DC Orders ED Discharge Orders     None

## 2022-07-21 NOTE — ED Notes (Signed)
Pt updated about plan of care, awaiting provider assessment. Pt laying in bed on phone, denies further needs at this time.

## 2022-07-28 ENCOUNTER — Ambulatory Visit: Payer: Medicaid Other | Admitting: Physician Assistant

## 2022-09-25 ENCOUNTER — Emergency Department (HOSPITAL_BASED_OUTPATIENT_CLINIC_OR_DEPARTMENT_OTHER): Payer: Medicaid Other | Admitting: Radiology

## 2022-09-25 ENCOUNTER — Other Ambulatory Visit: Payer: Self-pay

## 2022-09-25 ENCOUNTER — Encounter (HOSPITAL_BASED_OUTPATIENT_CLINIC_OR_DEPARTMENT_OTHER): Payer: Self-pay

## 2022-09-25 DIAGNOSIS — R109 Unspecified abdominal pain: Secondary | ICD-10-CM | POA: Insufficient documentation

## 2022-09-25 LAB — URINALYSIS, ROUTINE W REFLEX MICROSCOPIC
Bacteria, UA: NONE SEEN
Bilirubin Urine: NEGATIVE
Glucose, UA: NEGATIVE mg/dL
Ketones, ur: NEGATIVE mg/dL
Nitrite: NEGATIVE
Protein, ur: 30 mg/dL — AB
Specific Gravity, Urine: 1.017 (ref 1.005–1.030)
WBC, UA: >50 WBC/hpf (ref 0–5)
pH: 6.5 (ref 5.0–8.0)

## 2022-09-25 LAB — PREGNANCY, URINE: Preg Test, Ur: NEGATIVE

## 2022-09-25 LAB — BASIC METABOLIC PANEL
Anion gap: 8 (ref 5–15)
BUN: 9 mg/dL (ref 6–20)
CO2: 27 mmol/L (ref 22–32)
Calcium: 9.2 mg/dL (ref 8.9–10.3)
Chloride: 104 mmol/L (ref 98–111)
Creatinine, Ser: 0.81 mg/dL (ref 0.44–1.00)
GFR, Estimated: >60 mL/min (ref >60)
Glucose, Bld: 92 mg/dL (ref 70–99)
Potassium: 3.9 mmol/L (ref 3.5–5.1)
Sodium: 139 mmol/L (ref 135–145)

## 2022-09-25 LAB — CBC
HCT: 38.3 % (ref 36.0–46.0)
Hemoglobin: 12.8 g/dL (ref 12.0–15.0)
MCH: 30.2 pg (ref 26.0–34.0)
MCHC: 33.4 g/dL (ref 30.0–36.0)
MCV: 90.3 fL (ref 80.0–100.0)
Platelets: 317 10*3/uL (ref 150–400)
RBC: 4.24 MIL/uL (ref 3.87–5.11)
RDW: 12.9 % (ref 11.5–15.5)
WBC: 9 10*3/uL (ref 4.0–10.5)
nRBC: 0 % (ref 0.0–0.2)

## 2022-09-25 LAB — TROPONIN I (HIGH SENSITIVITY): Troponin I (High Sensitivity): <2 ng/L (ref <18)

## 2022-09-25 NOTE — ED Triage Notes (Signed)
Patient arrives to ED POV C/O back pain that radiates to left breast. Hx of Kidney infections but states she has never felt this pain that radiates to her chest. States she is also [facility].

## 2022-09-26 ENCOUNTER — Emergency Department (HOSPITAL_BASED_OUTPATIENT_CLINIC_OR_DEPARTMENT_OTHER)
Admission: EM | Admit: 2022-09-26 | Discharge: 2022-09-26 | Disposition: A | Discharge status: Home / Self Care | Payer: Medicaid Other | Attending: Emergency Medicine | Admitting: Emergency Medicine

## 2022-09-26 DIAGNOSIS — R109 Unspecified abdominal pain: Secondary | ICD-10-CM

## 2022-09-26 LAB — D-DIMER, QUANTITATIVE: D-Dimer, Quant: <0.27 ug{FEU}/mL (ref 0.00–0.50)

## 2022-09-26 MED ORDER — CEPHALEXIN 500 MG PO CAPS: 500.0000 mg | ORAL_CAPSULE | Freq: Four times a day (QID) | ORAL | 0 refills | Status: AC

## 2022-09-26 MED ORDER — NAPROXEN 250 MG PO TABS
500.0000 mg | ORAL_TABLET | Freq: Once | ORAL | Status: AC
  Administered 2022-09-26: 500 mg via ORAL
  Filled 2022-09-26: qty 2

## 2022-09-26 MED ORDER — NAPROXEN 500 MG PO TABS: 500.0000 mg | ORAL_TABLET | Freq: Two times a day (BID) | ORAL | 0 refills | Status: AC

## 2022-09-26 MED ORDER — CEPHALEXIN 250 MG PO CAPS
500.0000 mg | ORAL_CAPSULE | Freq: Once | ORAL | Status: AC
  Administered 2022-09-26: 500 mg via ORAL
  Filled 2022-09-26: qty 2

## 2022-09-26 MED ORDER — LIDOCAINE 5 % EX PTCH: 1.0000 | MEDICATED_PATCH | CUTANEOUS | 0 refills | Status: AC

## 2022-09-26 NOTE — Discharge Instructions (Signed)
You were evaluated in the Emergency Department and after careful evaluation, we did not find any emergent condition requiring admission or further testing in the hospital.  Your exam/testing today is overall reassuring.  Symptoms may be due to a kidney infection.  We also discussed the possibility of a muscle strain or spasm.  Take the Keflex antibiotic as directed.  Use the Naprosyn twice daily for pain.  Can also use the numbing patches as needed daily.  Please return to the Emergency Department if you experience any worsening of your condition.   Thank you for allowing [place] to be a part of your care.

## 2022-09-26 NOTE — ED Provider Notes (Signed)
[facility] Emergency Department Provider Note MRN:  [id number]  Arrival date & time: 09/26/22     Chief Complaint   Back Pain   History of Present Illness   Kristen Gomez is a 27 y.o. year-old female with no pertinent past medical history presenting to the ED with chief complaint of back pain.  Pain to the right flank for the past several hours.  Felt like maybe she had a bladder infection a few days ago but it got better.  Now the pain is worse and she has a lot of trouble moving because of the pain.  Worse with deep breaths.  Review of Systems  A thorough review of systems was obtained and all systems are negative except as noted in the HPI and PMH.   Patient's Health History    Past Medical History:  Diagnosis Date   Anxiety    Headache    History of cesarean section, low transverse 12/28/2017   S/P cesarean section 09/30/2014   UTI (urinary tract infection)     Past Surgical History:  Procedure Laterality Date   CESAREAN SECTION N/A 09/30/2014   Procedure: CESAREAN SECTION;  Surgeon: Sherian Rein, MD;  Location: [facility] ORS;  Service: Obstetrics;  Laterality: N/A;   CESAREAN SECTION N/A 12/31/2017   Procedure: REPEAT CESAREAN SECTION;  Surgeon: Sherian Rein, MD;  Location: [facility]TH[facility];  Service: Obstetrics;  Laterality: N/AHerbert Seta,  RNFA   CESAREAN SECTION N/A 09/24/2019   Procedure: CESAREAN SECTION;  Surgeon: Sherian Rein, MD;  Location: [facility] LD ORS;  Service: Obstetrics;  Laterality: N/A;  Tracey RNFA   TONSILLECTOMY      Family History  Problem Relation Age of Onset   Healthy Mother    Healthy Father     Social History   Socioeconomic History   Marital status: Married    Spouse name: Not on file   Number of children: Not on file   Years of education: Not on file   Highest education level: Not on file  Occupational History   Not on file  Tobacco Use   Smoking status: Never   Smokeless tobacco:  Never  Vaping Use   Vaping status: Never Used  Substance and Sexual Activity   Alcohol use: Yes    Comment: occasional   Drug use: Not Currently    Types: Marijuana   Sexual activity: Yes    Birth control/protection: Pill  Other Topics Concern   Not on file  Social History Narrative   Not on file   Social Determinants of Health   Financial Resource Strain: Low Risk  (12/20/2017)   Overall Financial Resource Strain (CARDIA)    Difficulty of Paying Living Expenses: Not hard at all  Food Insecurity: No Food Insecurity (12/20/2017)   Hunger Vital Sign    Worried About Running Out of Food in the Last Year: Never true    Ran Out of Food in the Last Year: Never true  Transportation Needs: Unknown (12/20/2017)   PRAPARE - Administrator, Civil Service (Medical): No    Lack of Transportation (Non-Medical): Not on file  Physical Activity: Not on file  Stress: No Stress Concern Present (12/20/2017)   [employer] of Occupational Health - Occupational Stress Questionnaire    Feeling of Stress : Not at all  Social Connections: Not on file  Intimate Partner Violence: Not At Risk (12/20/2017)   Humiliation, Afraid, Rape, and Kick questionnaire    Fear of Current  or Ex-Partner: No    Emotionally Abused: No    Physically Abused: No    Sexually Abused: No     Physical Exam   Vitals:   09/25/22 2140 09/26/22 0030  BP: 112/86 100/66  Pulse: 85 69  Resp: 18 16  Temp: 98.1 F (36.7 C) 98.2 F (36.8 C)  SpO2: 99% 100%    CONSTITUTIONAL: Well-appearing, NAD NEURO/PSYCH:  Alert and oriented x 3, no focal deficits EYES:  eyes equal and reactive ENT/NECK:  no LAD, no JVD CARDIO: Regular rate, well-perfused, normal S1 and S2 PULM:  CTAB no wheezing or rhonchi GI/GU:  non-distended, non-tender MSK/SPINE:  No gross deformities, no edema SKIN:  no rash, atraumatic   *Additional and/or pertinent findings included in MDM below  Diagnostic and Interventional Summary     EKG Interpretation Date/Time:  Monday September 25 2022 21:47:32 EDT Ventricular Rate:  81 PR Interval:  156 QRS Duration:  78 QT Interval:  376 QTC Calculation: 436 R Axis:   58  Text Interpretation: Normal sinus rhythm Normal ECG When compared with ECG of 22-Jan-2022 13:52, Nonspecific T wave abnormality no longer evident in Inferior leads Nonspecific T wave abnormality no longer evident in Anterolateral leads Confirmed by Kennis Carina [phone]) on 09/26/2022 12:37:00 AM       Labs Reviewed  URINALYSIS, ROUTINE W REFLEX MICROSCOPIC - Abnormal; Notable for the following components:      Result Value   Hgb urine dipstick TRACE (*)    Protein, ur 30 (*)    Leukocytes,Ua MODERATE (*)    All other components within normal limits  BASIC METABOLIC PANEL  CBC  PREGNANCY, URINE  D-DIMER, QUANTITATIVE  TROPONIN I (HIGH SENSITIVITY)    DG Ribs Unilateral W/Chest Left  Final Result      Medications  naproxen (NAPROSYN) tablet 500 mg (500 mg Oral Given 09/26/22 0102)  cephALEXin (KEFLEX) capsule 500 mg (500 mg Oral Given 09/26/22 0102)     Procedures  /  Critical Care Procedures  ED Course and Medical Decision Making  Initial Impression and Ddx Back pain/flank pain radiating forward into the right anterior ribs/right chest.  Worse with deep breaths.  Takes birth control pills.  Low risk but PE is considered.  Other considerations include kidney stone,, nephritis, MSK related pain.  Past medical/surgical history that increases complexity of ED encounter: None  Interpretation of Diagnostics I personally reviewed the EKG and my interpretation is as follows: Sinus rhythm without concerning features  Labs reassuring with no significant blood count or electrolyte disturbance.  Troponin negative, D-dimer negative  Patient Reassessment and Ultimate Disposition/Management     Urinalysis with large white blood cells, will treat for pyelonephritis.  No sepsis, no fever, appropriate for  discharge.  Patient management required discussion with the following services or consulting groups:  None  Complexity of Problems Addressed Acute illness or injury that poses threat of life of bodily function  Additional Data Reviewed and Analyzed Further history obtained from: None  Additional Factors Impacting ED Encounter Risk Prescriptions  Elmer Sow. Pilar Plate, MD [facility] [email or web address]  Final Clinical Impressions(s) / ED Diagnoses     ICD-10-CM   1. Flank pain  R10.9       ED Discharge Orders          Ordered    lidocaine (LIDODERM) 5 %  Every 24 hours        09/26/22 0136    cephALEXin (KEFLEX) 500 MG capsule  4 times daily        09/26/22 0136    naproxen (NAPROSYN) 500 MG tablet  2 times daily        09/26/22 0136             Discharge Instructions Discussed with and Provided to Patient:    Discharge Instructions      You were evaluated in the Emergency Department and after careful evaluation, we did not find any emergent condition requiring admission or further testing in the hospital.  Your exam/testing today is overall reassuring.  Symptoms may be due to a kidney infection.  We also discussed the possibility of a muscle strain or spasm.  Take the Keflex antibiotic as directed.  Use the Naprosyn twice daily for pain.  Can also use the numbing patches as needed daily.  Please return to the Emergency Department if you experience any worsening of your condition.   Thank you for allowing [place] to be a part of your care.      Sabas Sous, MD 09/26/22 [id number]
